# Patient Record
Sex: Female | Born: 1965 | Race: White | Hispanic: No | Marital: Married | Smoking: Former smoker
Health system: Southern US, Community
[De-identification: ages and names within clinical notes are randomized; demographics above are authoritative.]

## PROBLEM LIST (undated history)

## (undated) DIAGNOSIS — K219 Gastro-esophageal reflux disease without esophagitis: Secondary | ICD-10-CM

## (undated) DIAGNOSIS — G43909 Migraine, unspecified, not intractable, without status migrainosus: Secondary | ICD-10-CM

## (undated) DIAGNOSIS — J45909 Unspecified asthma, uncomplicated: Secondary | ICD-10-CM

## (undated) DIAGNOSIS — M5432 Sciatica, left side: Secondary | ICD-10-CM

## (undated) HISTORY — PX: GASTRIC BYPASS: SHX52

## (undated) HISTORY — PX: UMBILICAL HERNIA REPAIR: SHX196

## (undated) HISTORY — PX: ABDOMINAL HYSTERECTOMY: SHX81

## (undated) HISTORY — PX: SPLENECTOMY, TOTAL: SHX788

---

## 2010-07-01 ENCOUNTER — Ambulatory Visit: Payer: Self-pay | Admitting: Internal Medicine

## 2010-10-14 ENCOUNTER — Ambulatory Visit: Payer: Self-pay | Admitting: Family Medicine

## 2011-04-04 ENCOUNTER — Ambulatory Visit: Payer: Self-pay

## 2013-03-18 ENCOUNTER — Ambulatory Visit: Payer: Self-pay | Admitting: Bariatrics

## 2015-03-08 ENCOUNTER — Ambulatory Visit
Admission: EM | Admit: 2015-03-08 | Discharge: 2015-03-08 | Disposition: A | Discharge status: Home / Self Care | Payer: 59 | Attending: Family Medicine | Admitting: Family Medicine

## 2015-03-08 ENCOUNTER — Encounter: Payer: Self-pay | Admitting: Gynecology

## 2015-03-08 DIAGNOSIS — J018 Other acute sinusitis: Secondary | ICD-10-CM

## 2015-03-08 DIAGNOSIS — J069 Acute upper respiratory infection, unspecified: Secondary | ICD-10-CM

## 2015-03-08 HISTORY — DX: Migraine, unspecified, not intractable, without status migrainosus: G43.909

## 2015-03-08 HISTORY — DX: Unspecified asthma, uncomplicated: J45.909

## 2015-03-08 MED ORDER — AZITHROMYCIN 250 MG PO TABS: ORAL_TABLET | ORAL | Status: DC

## 2015-03-08 NOTE — ED Provider Notes (Signed)
CSN: [id number]     Arrival date & time 03/08/15  [id number] History   First MD Initiated Contact with Patient 03/08/15 1134     Chief Complaint  Patient presents with  . URI   (Consider location/radiation/quality/duration/timing/severity/associated sxs/prior Treatment) HPI: Patient presents today with symptoms of sinus congestion, mild productive cough, wheezing at times. Patient states that she's had the symptoms for the last 5 days. She does have a history of asthma and does take her albuterol as needed. She has had uses once to twice daily recently. She denies any chest pain, shortness of breath, nausea, vomiting, diarrhea, abdominal pain.  Past Medical History  Diagnosis Date  . Asthma   . Migraine    Past Surgical History  Procedure Laterality Date  . Gastric bypass  1997 and 2005  . Abdominal hysterectomy    . Umbilical hernia repair     No family history on file. Social History  Substance Use Topics  . Smoking status: Former Research scientist (life sciences)  . Smokeless tobacco: None  . Alcohol Use: No   OB History    No data available     Review of Systems: Negative except mentioned above.   Allergies  Penicillins; Asa; and Morphine and related  Home Medications   Prior to Admission medications   Medication Sig Start Date End Date Taking? Authorizing Provider  albuterol (PROVENTIL HFA;VENTOLIN HFA) 108 (90 Base) MCG/ACT inhaler Inhale into the lungs every 6 (six) hours as needed for wheezing or shortness of breath.   Yes Historical Provider, MD  cyclobenzaprine (FLEXERIL) 10 MG tablet Take 10 mg by mouth 3 (three) times daily as needed for muscle spasms.   Yes Historical Provider, MD  eletriptan (RELPAX) 40 MG tablet Take 40 mg by mouth as needed for migraine or headache. May repeat in 2 hours if headache persists or recurs.   Yes Historical Provider, MD  Promethazine HCl (PHENERGAN PO) Take 10 mg by mouth.   Yes Historical Provider, MD  SUMAtriptan (IMITREX) 6 MG/0.5ML SOLN injection Inject 6  mg into the skin every 2 (two) hours as needed for migraine or headache. May repeat in 2 hours if headache persists or recurs.   Yes Historical Provider, MD  topiramate (TOPAMAX) 100 MG tablet Take 100 mg by mouth 2 (two) times daily.   Yes Historical Provider, MD  vitamin B-12 (CYANOCOBALAMIN) 1000 MCG tablet Take 2,000 mcg by mouth daily.   Yes Historical Provider, MD  azithromycin (ZITHROMAX Z-PAK) 250 MG tablet Use as directed for 5 days. 03/08/15   Paulina Fusi, MD   Meds Ordered and Administered this Visit  Medications - No data to display  BP 110/63 mmHg  Pulse 72  Temp(Src) 98 F (36.7 C) (Oral)  Resp 18  Ht 5\' 2"  (1.575 m)  Wt 220 lb (99.791 kg)  BMI 40.23 kg/m2  SpO2 99% No data found.   Physical Exam   GENERAL: NAD HEENT: mild pharyngeal erythema, no exudate, no erythema of TMs, no cervical LAD RESP: CTA B, no accessory muscle use CARD: RRR NEURO: CN II-XII grossly intact   ED Course  Procedures (including critical care time)  Labs Review Labs Reviewed - No data to display  Imaging Review No results found.     MDM   1. URI (upper respiratory infection)   2. Other acute sinusitis   Discussed with patient to take oral antihistamine along with her nasal steroid, pseudoephedrine if needed. She can start the antibiotic in a few days if her symptoms do  not improve. Over-the-counter pain medication as needed, rest, hydration. She is to use her albuterol any inhaler as needed as well. If symptoms do persist or worsen I do recommend the patient seek medical attention as discussed.   Paulina Fusi, MD 03/08/15 1258

## 2015-03-08 NOTE — ED Notes (Signed)
Patient stated sinus drainage / green mucus / congestion/ coughing and wheezing.

## 2015-07-29 ENCOUNTER — Ambulatory Visit
Admission: EM | Admit: 2015-07-29 | Discharge: 2015-07-29 | Disposition: A | Discharge status: Home / Self Care | Payer: 59 | Attending: Family Medicine | Admitting: Family Medicine

## 2015-07-29 DIAGNOSIS — L259 Unspecified contact dermatitis, unspecified cause: Secondary | ICD-10-CM | POA: Diagnosis not present

## 2015-07-29 MED ORDER — PREDNISONE 20 MG PO TABS: 20.0000 mg | ORAL_TABLET | Freq: Every day | ORAL | Status: DC

## 2015-07-29 NOTE — ED Notes (Signed)
As per pt unsure if red tiny spots on her elbow hand legs and abdominal area are from poison oak or spider bites onset 6 days.

## 2015-07-29 NOTE — ED Provider Notes (Signed)
CSN: [id number]     Arrival date & time 07/29/15  1405 History   First MD Initiated Contact with Patient 07/29/15 1428     Chief Complaint  Patient presents with  . Poison Ivy   (Consider location/radiation/quality/duration/timing/severity/associated sxs/prior Treatment) HPI Comments: 50 yo female with a c/o itchy rash on legs and arms for 4-5 days after being exposed to poison oak while working in yard. Denies any fevers, chills, shortness of breath, wheezing, drainage.   The history is provided by the patient.    Past Medical History  Diagnosis Date  . Asthma   . Migraine    Past Surgical History  Procedure Laterality Date  . Gastric bypass  1997 and 2005  . Abdominal hysterectomy    . Umbilical hernia repair     No family history on file. Social History  Substance Use Topics  . Smoking status: Former Research scientist (life sciences)  . Smokeless tobacco: Not on file  . Alcohol Use: No   OB History    No data available     Review of Systems  Allergies  Penicillins; Asa; and Morphine and related  Home Medications   Prior to Admission medications   Medication Sig Start Date End Date Taking? Authorizing Provider  albuterol (PROVENTIL HFA;VENTOLIN HFA) 108 (90 Base) MCG/ACT inhaler Inhale into the lungs every 6 (six) hours as needed for wheezing or shortness of breath.   Yes Historical Provider, MD  cyclobenzaprine (FLEXERIL) 10 MG tablet Take 10 mg by mouth 3 (three) times daily as needed for muscle spasms.   Yes Historical Provider, MD  eletriptan (RELPAX) 40 MG tablet Take 40 mg by mouth as needed for migraine or headache. May repeat in 2 hours if headache persists or recurs.   Yes Historical Provider, MD  Promethazine HCl (PHENERGAN PO) Take 10 mg by mouth.   Yes Historical Provider, MD  SUMAtriptan (IMITREX) 6 MG/0.5ML SOLN injection Inject 6 mg into the skin every 2 (two) hours as needed for migraine or headache. May repeat in 2 hours if headache persists or recurs.   Yes Historical  Provider, MD  topiramate (TOPAMAX) 100 MG tablet Take 100 mg by mouth 2 (two) times daily.   Yes Historical Provider, MD  vitamin B-12 (CYANOCOBALAMIN) 1000 MCG tablet Take 2,000 mcg by mouth daily.   Yes Historical Provider, MD  azithromycin (ZITHROMAX Z-PAK) 250 MG tablet Use as directed for 5 days. 03/08/15   Paulina Fusi, MD  predniSONE (DELTASONE) 20 MG tablet Take 1 tablet (20 mg total) by mouth daily. 07/29/15   Norval Gable, MD   Meds Ordered and Administered this Visit  Medications - No data to display  BP 111/60 mmHg  Pulse 67  Temp(Src) 97.9 F (36.6 C) (Oral)  Resp 16  Ht 5\' 2"  (1.575 m)  Wt 230 lb (104.327 kg)  BMI 42.06 kg/m2  SpO2 98% No data found.   Physical Exam  Constitutional: She appears well-developed and well-nourished. No distress.  Skin: Rash noted. Rash is papular and vesicular. She is not diaphoretic. There is erythema.  Nursing note and vitals reviewed.   ED Course  Procedures (including critical care time)  Labs Review Labs Reviewed - No data to display  Imaging Review No results found.   Visual Acuity Review  Right Eye Distance:   Left Eye Distance:   Bilateral Distance:    Right Eye Near:   Left Eye Near:    Bilateral Near:         MDM  1. Contact dermatitis    Discharge Medication List as of 07/29/2015  2:43 PM    START taking these medications   Details  predniSONE (DELTASONE) 20 MG tablet Take 1 tablet (20 mg total) by mouth daily., Starting 07/29/2015, Until Discontinued, Normal       1.  diagnosis reviewed with patient 2. rx as per orders above; reviewed possible side effects, interactions, risks and benefits  3. Recommend supportive treatment with home steroid cream 4. Follow-up prn if symptoms worsen or don't improve    Norval Gable, MD 07/29/15 1622

## 2015-08-30 ENCOUNTER — Ambulatory Visit
Admission: EM | Admit: 2015-08-30 | Discharge: 2015-08-30 | Disposition: A | Discharge status: Home / Self Care | Payer: 59 | Attending: Family Medicine | Admitting: Family Medicine

## 2015-08-30 ENCOUNTER — Ambulatory Visit (INDEPENDENT_AMBULATORY_CARE_PROVIDER_SITE_OTHER): Payer: 59

## 2015-08-30 DIAGNOSIS — T148 Other injury of unspecified body region: Secondary | ICD-10-CM

## 2015-08-30 DIAGNOSIS — S62652A Nondisplaced fracture of medial phalanx of right middle finger, initial encounter for closed fracture: Secondary | ICD-10-CM | POA: Diagnosis not present

## 2015-08-30 DIAGNOSIS — T148XXA Other injury of unspecified body region, initial encounter: Secondary | ICD-10-CM

## 2015-08-30 MED ORDER — ACETAMINOPHEN 325 MG PO TABS
650.0000 mg | ORAL_TABLET | Freq: Once | ORAL | Status: AC
  Administered 2015-08-30: 650 mg via ORAL

## 2015-08-30 NOTE — Discharge Instructions (Signed)
Rest. Apply ice. Wear finger splint.   Follow up with your primary care physician this next week. Return to Urgent care for new or worsening concerns.

## 2015-08-30 NOTE — ED Provider Notes (Signed)
[facility] ____________________________________________  Time seen: Approximately 11:56 AM  I have reviewed the triage vital signs and the nursing notes.   HISTORY  Chief Complaint Finger Injury   HPI Marisa Lewis is a 50 y.o. female presents for evaluation of right middle finger pain. Patient reports 9 days ago she had went to [employer], and reports in the process of getting out of her ride she tripped on the edge side causing her to fall when getting out of the right. Patient reports when she fell she caught herself with her right arm and her right middle finger bent backwards. Patient also reports that she did hit her right forearm and right lower leg which has bruised and had minimal pain. Patient reports she is here for evaluation of right middle finger.  Cyst removal finger pain as mild. Patient reports pain is mostly when directly palpated. Patient reports she has been wearing a finger splint for the last few days that she got over-the-counter that does help with the pain. Denies any numbness or loss of sensation. Denies any pain radiation. Patient reports she is able to fully move the same finger but with pain with flexion and extension. Denies head injury or loss consciousness. Denies neck or back pain or injury. Denies any other complaints at this time.  Reports right hand dominant.  No LMP recorded. Patient is postmenopausal.  Pearletha Alfred, PA PCP   Past Medical History:  Diagnosis Date  . Asthma   . Migraine     There are no active problems to display for this patient.   Past Surgical History:  Procedure Laterality Date  . ABDOMINAL HYSTERECTOMY    . GASTRIC BYPASS  1997 and 2005  . UMBILICAL HERNIA REPAIR      No current facility-administered medications for this encounter.   Current Outpatient Prescriptions:  .  cyclobenzaprine (FLEXERIL) 10 MG tablet, Take 10 mg by mouth 3 (three) times daily as needed for muscle spasms., Disp: , Rfl:   .  eletriptan (RELPAX) 40 MG tablet, Take 40 mg by mouth as needed for migraine or headache. May repeat in 2 hours if headache persists or recurs., Disp: , Rfl:  .  Promethazine HCl (PHENERGAN PO), Take 10 mg by mouth., Disp: , Rfl:  .  SUMAtriptan (IMITREX) 6 MG/0.5ML SOLN injection, Inject 6 mg into the skin every 2 (two) hours as needed for migraine or headache. May repeat in 2 hours if headache persists or recurs., Disp: , Rfl:  .  topiramate (TOPAMAX) 100 MG tablet, Take 100 mg by mouth 2 (two) times daily., Disp: , Rfl:  .  vitamin B-12 (CYANOCOBALAMIN) 1000 MCG tablet, Take 2,000 mcg by mouth daily., Disp: , Rfl:  .  albuterol (PROVENTIL HFA;VENTOLIN HFA) 108 (90 Base) MCG/ACT inhaler, Inhale into the lungs every 6 (six) hours as needed for wheezing or shortness of breath., Disp: , Rfl:  .  azithromycin (ZITHROMAX Z-PAK) 250 MG tablet, Use as directed for 5 days., Disp: 6 each, Rfl: 0 .  predniSONE (DELTASONE) 20 MG tablet, Take 1 tablet (20 mg total) by mouth daily., Disp: 7 tablet, Rfl: 0  Allergies Penicillins; Asa [aspirin]; and Morphine and related  Family History  Problem Relation Age of Onset  . Kidney failure Mother     Social History Social History  Substance Use Topics  . Smoking status: Former Research scientist (life sciences)  . Smokeless tobacco: Never Used  . Alcohol use No    Review of Systems Constitutional: No fever/chills Eyes:  No visual changes. ENT: No sore throat. Cardiovascular: Denies chest pain. Respiratory: Denies shortness of breath. Gastrointestinal: No abdominal pain.  No nausea, no vomiting.  No diarrhea.  No constipation. Genitourinary: Negative for dysuria. Musculoskeletal: Negative for back pain. As above. Skin: Negative for rash. Neurological: Negative for headaches, focal weakness or numbness.  10-point ROS otherwise negative.  ____________________________________________   PHYSICAL EXAM:  VITAL SIGNS: ED Triage Vitals  Enc Vitals Group     BP 08/30/15  1123 120/74     Pulse Rate 08/30/15 1123 64     Resp 08/30/15 1123 16     Temp 08/30/15 1123 97.8 F (36.6 C)     Temp Source 08/30/15 1123 Tympanic     SpO2 08/30/15 1123 100 %     Weight 08/30/15 1123 235 lb (106.6 kg)     Height 08/30/15 1123 5\' 2"  (1.575 m)     Head Circumference --      Peak Flow --      Pain Score 08/30/15 1126 4     Pain Loc --      Pain Edu? --      Excl. in [place]? --     Constitutional: Alert and oriented. Well appearing and in no acute distress. Eyes: Conjunctivae are normal. PERRL. EOMI. ENT      Head: Normocephalic and atraumatic.      Mouth/Throat: Mucous membranes are moist. Cardiovascular: Normal rate, regular rhythm. Grossly normal heart sounds.  Good peripheral circulation. Respiratory: Normal respiratory effort without tachypnea nor retractions. Breath sounds are clear and equal bilaterally. No wheezes/rales/rhonchi.. Musculoskeletal:  Ambulatory with steady gait. No midline cervical, thoracic or lumbar tenderness to palpation. Bilateral pedal pulses equal and easily palpated. Except: right middle finger proximal middle phalanx and PIP joint mild tenderness to palpation, minimal swelling, no ecchymosis, mild pain with PIP flexion and extension but range of motion intact, right hand otherwise nontender, no motor or tendon deficits, able to touch thumb to each finger tip well without difficulty, able to make full fist, sensation intact with normal distal capillary refill.  Except: right posterior forearm mild ecchymosis, minimal soft tissue tenderness, no bony tenderness, full range of motion.  Except: right proximal medial tibial area mild ecchymosis, mild tenderness to direct palpation, knee nontender, no pain with range of motion with full range of motion intact, sensation intact, steady gait, right lower extremity otherwise nontender.  Neurologic:  Normal speech and language. No gross focal neurologic deficits are appreciated. Speech is normal. No gait  instability.  Skin:  Skin is warm, dry and intact. No rash noted. Psychiatric: Mood and affect are normal. Speech and behavior are normal. Patient exhibits appropriate insight and judgment.   ___________________________________________   LABS (all labs ordered are listed, but only abnormal results are displayed)  Labs Reviewed - No data to display ____________________________________________  RADIOLOGY  Dg Finger Middle Right  Result Date: 08/30/2015 CLINICAL DATA:  Tripped injuring the right third digit EXAM: RIGHT MIDDLE FINGER 2+V COMPARISON:  None. FINDINGS: On on the lateral view, there may be a small avulsion fragment from the base of the middle phalanx of the right third digit. No other abnormality is seen. Joint spaces appear normal. IMPRESSION: Cannot exclude a small avulsion fracture fragment from the base of the middle phalanx of the right third digit on one view only. Electronically Signed   By: Ivar Drape M.D.   On: 08/30/2015 12:14   ____________________________________________   PROCEDURES Procedures   Finger splint applied to  right third finger by CMA, neurovascular intact post application.   INITIAL IMPRESSION / ASSESSMENT AND PLAN / ED COURSE  Pertinent labs & imaging results that were available during my care of the patient were reviewed by me and considered in my medical decision making (see chart for details).  Presents for right middle finger pain post mechanical fall a week ago. Also with ecchymosis to right forearm and right lower leg, declines xrays of these areas. Will evaluate right middle finger xray.   Right middle finger xray per radiologist cannot exclude a small avulsion fracture fragment from the base of the middle phalanx of the right third digit on one view only. Discussed xray finding with patient, and as patient point tender in same location, suspect acute fracture. Finger splint. Encourage rest, ice and supportive measures. Declines need for  prescription medications. Discussed stretching. Follow up in one week with PCP.   Discussed follow up with Primary care physician this week. Discussed follow up and return parameters including no resolution or any worsening concerns. Patient verbalized understanding and agreed to plan.   ____________________________________________   FINAL CLINICAL IMPRESSION(S) / ED DIAGNOSES  Final diagnoses:  Closed nondisplaced fracture of middle phalanx of right middle finger, initial encounter  Contusion     Discharge Medication List as of 08/30/2015 12:33 PM      Note: This dictation was prepared with Dragon dictation along with smaller phrase technology. Any transcriptional errors that result from this process are unintentional.    Clinical Course      Marylene Land, NP 08/30/15 1324

## 2015-08-30 NOTE — ED Triage Notes (Signed)
Patient states that she fell out of the "[place] [employer] ride at [facility]" when it stopped. Patient states that this occurred 9 days ago. Patient reports pain and tenderness on right hand middle finger. Patient states that finger is painful to move with swelling.

## 2016-12-06 ENCOUNTER — Encounter: Payer: Self-pay | Admitting: *Deleted

## 2016-12-06 ENCOUNTER — Ambulatory Visit
Admission: EM | Admit: 2016-12-06 | Discharge: 2016-12-06 | Disposition: A | Discharge status: Home / Self Care | Payer: 59 | Attending: Family Medicine | Admitting: Family Medicine

## 2016-12-06 DIAGNOSIS — J988 Other specified respiratory disorders: Secondary | ICD-10-CM

## 2016-12-06 MED ORDER — DOXYCYCLINE HYCLATE 100 MG PO CAPS: 100.0000 mg | ORAL_CAPSULE | Freq: Two times a day (BID) | ORAL | 0 refills | Status: DC

## 2016-12-06 NOTE — ED Provider Notes (Signed)
[facility]    CSN: [id number] Arrival date & time: 12/06/16  1040  History   Chief Complaint Chief Complaint  Patient presents with  . Cough  . Nasal Congestion  . Facial Pain   HPI  51 year old female presents with concerns for sinusitis.  Patient reports that she has been sick since Monday.  Her wife is also been sick.  She reports sinus pressure, pain, and congestion.  Associated headache.  She is also had a productive cough.  She states that her symptoms seem to be persisting despite over-the-counter treatment with Mucinex, Benadryl, and NyQuil.  No known exacerbating factors.  She reports discolored sputum  with a cough and coming from the nose.  No other associated symptoms.  No other complaints at this time.  Past Medical History:  Diagnosis Date  . Asthma   . Migraine    Past Surgical History:  Procedure Laterality Date  . ABDOMINAL HYSTERECTOMY    . GASTRIC BYPASS  1997 and 2005  . UMBILICAL HERNIA REPAIR     OB History    No data available     Home Medications    Prior to Admission medications   Medication Sig Start Date End Date Taking? Authorizing Provider  albuterol (PROVENTIL HFA;VENTOLIN HFA) 108 (90 Base) MCG/ACT inhaler Inhale into the lungs every 6 (six) hours as needed for wheezing or shortness of breath.   Yes [provider]  SUMAtriptan (IMITREX) 6 MG/0.5ML SOLN injection Inject 6 mg into the skin every 2 (two) hours as needed for migraine or headache. May repeat in 2 hours if headache persists or recurs.   Yes [provider]  topiramate (TOPAMAX) 100 MG tablet Take 100 mg by mouth 2 (two) times daily.   Yes [provider]  vitamin B-12 (CYANOCOBALAMIN) 1000 MCG tablet Take 2,000 mcg by mouth daily.   Yes [provider]  cyclobenzaprine (FLEXERIL) 10 MG tablet Take 10 mg by mouth 3 (three) times daily as needed for muscle spasms.    [provider]  doxycycline (VIBRAMYCIN) 100 MG capsule  Take 1 capsule (100 mg total) by mouth 2 (two) times daily. 12/06/16   Coral Spikes, DO  eletriptan (RELPAX) 40 MG tablet Take 40 mg by mouth as needed for migraine or headache. May repeat in 2 hours if headache persists or recurs.    [provider]  Promethazine HCl (PHENERGAN PO) Take 10 mg by mouth.    [provider]    Family History Family History  Problem Relation Age of Onset  . Kidney failure Mother   . Diabetes Mother   . Hyperlipidemia Mother   . COPD Mother   . Diabetes Father     Social History Social History   Tobacco Use  . Smoking status: Former Research scientist (life sciences)  . Smokeless tobacco: Never Used  Substance Use Topics  . Alcohol use: No  . Drug use: No     Allergies   Penicillins; Asa [aspirin]; and Morphine and related   Review of Systems Review of Systems  Constitutional: Negative for fever.  HENT: Positive for congestion, sinus pressure and sinus pain.   Respiratory: Positive for cough.   Neurological: Positive for headaches.   Physical Exam Triage Vital Signs ED Triage Vitals  Enc Vitals Group     BP 12/06/16 1101 119/70     Pulse Rate 12/06/16 1101 73     Resp 12/06/16 1101 16     Temp 12/06/16 1101 98.2 F (36.8 C)  Temp Source 12/06/16 1101 Oral     SpO2 12/06/16 1101 99 %     Weight 12/06/16 1103 220 lb (99.8 kg)     Height 12/06/16 1103 5\' 1"  (1.549 m)     Head Circumference --      Peak Flow --      Pain Score --      Pain Loc --      Pain Edu? --      Excl. in [place]? --    No data found.  Updated Vital Signs BP 119/70 (BP Location: Left Arm)   Pulse 73   Temp 98.2 F (36.8 C) (Oral)   Resp 16   Ht 5\' 1"  (1.549 m)   Wt 220 lb (99.8 kg)   SpO2 99%   BMI 41.57 kg/m   Visual Acuity Right Eye Distance:   Left Eye Distance:   Bilateral Distance:    Right Eye Near:   Left Eye Near:    Bilateral Near:     Physical Exam  Constitutional: She is oriented to person, place, and time. She appears well-developed. No  distress.  HENT:  Head: Normocephalic and atraumatic.  Nose: Nose normal.  Oropharynx with mild erythema. No nasal discharge.  Neck: Neck supple.  Cardiovascular: Normal rate and regular rhythm.  No murmur heard. Pulmonary/Chest: Effort normal and breath sounds normal. She has no wheezes. She has no rales.  Lymphadenopathy:    She has no cervical adenopathy.  Neurological: She is alert and oriented to person, place, and time.  Skin: Skin is warm. No rash noted.  Psychiatric: She has a normal mood and affect. Her behavior is normal.  Vitals reviewed.    UC Treatments / Results  Labs (all labs ordered are listed, but only abnormal results are displayed) Labs Reviewed - No data to display  EKG  EKG Interpretation None       Radiology No results found.  Procedures Procedures (including critical care time)  Medications Ordered in UC Medications - No data to display   Initial Impression / Assessment and Plan / UC Course  I have reviewed the triage vital signs and the nursing notes.  Pertinent labs & imaging results that were available during my care of the patient were reviewed by me and considered in my medical decision making (see chart for details).     51 year old female presents with likely viral infection.  I advised her that this is likely viral and that her wife's infection is also likely viral.  She states that her wife is currently on antibiotics.  I discussed with her that I think she should continue supportive care and over-the-counter treatments.  I did give her an antibiotic if she fails to improve or worsens (delayed antibiotic use).  Final Clinical Impressions(s) / UC Diagnoses   Final diagnoses:  Respiratory infection    ED Discharge Orders        Ordered    doxycycline (VIBRAMYCIN) 100 MG capsule  2 times daily     12/06/16 1211     Controlled Substance Prescriptions [place] Controlled Substance Registry consulted? Not Applicable   Coral Spikes, DO 12/06/16 1229

## 2016-12-06 NOTE — Discharge Instructions (Signed)
This is likely viral.  I would continue your current medications and continue to give it some time.  You can fill the antibiotic if you worsen or fail to improve.  Take care  Dr. Lacinda Axon

## 2016-12-06 NOTE — ED Triage Notes (Signed)
Productive cough- yellow, nasal discharge- yellow, facial pain, x3-4 days.

## 2017-11-16 ENCOUNTER — Other Ambulatory Visit: Payer: Self-pay

## 2017-11-16 ENCOUNTER — Ambulatory Visit
Admission: EM | Admit: 2017-11-16 | Discharge: 2017-11-16 | Disposition: A | Discharge status: Home / Self Care | Payer: 59 | Attending: Family Medicine | Admitting: Family Medicine

## 2017-11-16 DIAGNOSIS — R059 Cough, unspecified: Secondary | ICD-10-CM

## 2017-11-16 DIAGNOSIS — R05 Cough: Secondary | ICD-10-CM

## 2017-11-16 DIAGNOSIS — J0100 Acute maxillary sinusitis, unspecified: Secondary | ICD-10-CM | POA: Diagnosis not present

## 2017-11-16 MED ORDER — DOXYCYCLINE HYCLATE 100 MG PO TABS: 100.0000 mg | ORAL_TABLET | Freq: Two times a day (BID) | ORAL | 0 refills | Status: DC

## 2017-11-16 NOTE — ED Triage Notes (Signed)
Patient complains of cough, congestion, ear pressure, headache, sinus pain and pressure x 8 days. Patient states that she has been trying sudafed and mucinex without much relief.

## 2017-11-16 NOTE — ED Provider Notes (Signed)
[facility]    CSN: [id number] Arrival date & time: 11/16/17  1103     History   Chief Complaint Chief Complaint  Patient presents with  . Cough    HPI Marisa Lewis is a 52 y.o. female.   The history is provided by the patient.  Cough  Associated symptoms: ear pain and rhinorrhea   Associated symptoms: no wheezing   URI  Presenting symptoms: congestion, cough, ear pain, facial pain and rhinorrhea   Severity:  Moderate Onset quality:  Sudden Duration:  8 days Timing:  Constant Progression:  Worsening Chronicity:  New Relieved by:  Nothing Ineffective treatments:  OTC medications Associated symptoms: sinus pain   Associated symptoms: no wheezing   Risk factors: chronic respiratory disease (asthma) and sick contacts     Past Medical History:  Diagnosis Date  . Asthma   . Migraine     There are no active problems to display for this patient.   Past Surgical History:  Procedure Laterality Date  . ABDOMINAL HYSTERECTOMY    . GASTRIC BYPASS  1997 and 2005  . UMBILICAL HERNIA REPAIR      OB History   None      Home Medications    Prior to Admission medications   Medication Sig Start Date End Date Taking? Authorizing Provider  albuterol (PROVENTIL HFA;VENTOLIN HFA) 108 (90 Base) MCG/ACT inhaler Inhale into the lungs every 6 (six) hours as needed for wheezing or shortness of breath.   Yes [provider]  cyclobenzaprine (FLEXERIL) 10 MG tablet Take 10 mg by mouth 3 (three) times daily as needed for muscle spasms.   Yes [provider]  eletriptan (RELPAX) 40 MG tablet Take 40 mg by mouth as needed for migraine or headache. May repeat in 2 hours if headache persists or recurs.   Yes [provider]  ferrous sulfate 325 (65 FE) MG tablet Take 325 mg by mouth daily with breakfast.   Yes [provider]  Promethazine HCl (PHENERGAN PO) Take 10 mg by mouth.   Yes [provider]  SAXENDA 18 MG/3ML SOPN   09/19/17  Yes [provider]  SUMAtriptan (IMITREX) 6 MG/0.5ML SOLN injection Inject 6 mg into the skin every 2 (two) hours as needed for migraine or headache. May repeat in 2 hours if headache persists or recurs.   Yes [provider]  topiramate (TOPAMAX) 100 MG tablet Take 100 mg by mouth 2 (two) times daily.   Yes [provider]  vitamin B-12 (CYANOCOBALAMIN) 1000 MCG tablet Take 2,000 mcg by mouth daily.   Yes [provider]  doxycycline (VIBRA-TABS) 100 MG tablet Take 1 tablet (100 mg total) by mouth 2 (two) times daily. 11/16/17   Norval Gable, MD    Family History Family History  Problem Relation Age of Onset  . Kidney failure Mother   . Diabetes Mother   . Hyperlipidemia Mother   . COPD Mother   . Diabetes Father     Social History Social History   Tobacco Use  . Smoking status: Former Research scientist (life sciences)  . Smokeless tobacco: Never Used  Substance Use Topics  . Alcohol use: No  . Drug use: No     Allergies   Penicillins; Asa [aspirin]; and Morphine and related   Review of Systems Review of Systems  HENT: Positive for congestion, ear pain, rhinorrhea and sinus pain.   Respiratory: Positive for cough. Negative for wheezing.      Physical Exam Triage  Vital Signs ED Triage Vitals  Enc Vitals Group     BP 11/16/17 1118 119/71     Pulse Rate 11/16/17 1118 (!) 18     Resp 11/16/17 1118 18     Temp 11/16/17 1118 98.3 F (36.8 C)     Temp Source 11/16/17 1118 Oral     SpO2 11/16/17 1118 99 %     Weight 11/16/17 1115 220 lb (99.8 kg)     Height 11/16/17 1115 5\' 1"  (1.549 m)     Head Circumference --      Peak Flow --      Pain Score 11/16/17 1115 3     Pain Loc --      Pain Edu? --      Excl. in [place]? --    No data found.  Updated Vital Signs BP 119/71 (BP Location: Right Arm)   Pulse (!) 18   Temp 98.3 F (36.8 C) (Oral)   Resp 18   Ht 5\' 1"  (1.549 m)   Wt 99.8 kg   SpO2 99%   BMI 41.57 kg/m   Visual Acuity Right  Eye Distance:   Left Eye Distance:   Bilateral Distance:    Right Eye Near:   Left Eye Near:    Bilateral Near:     Physical Exam  Constitutional: She appears well-developed and well-nourished. No distress.  HENT:  Head: Normocephalic and atraumatic.  Right Ear: Tympanic membrane, external ear and ear canal normal.  Left Ear: Tympanic membrane, external ear and ear canal normal.  Nose: Mucosal edema and rhinorrhea present. No nose lacerations, sinus tenderness, nasal deformity, septal deviation or nasal septal hematoma. No epistaxis.  No foreign bodies. Right sinus exhibits maxillary sinus tenderness and frontal sinus tenderness. Left sinus exhibits maxillary sinus tenderness and frontal sinus tenderness.  Mouth/Throat: Uvula is midline, oropharynx is clear and moist and mucous membranes are normal. No oropharyngeal exudate.  Eyes: Conjunctivae are normal. Right eye exhibits no discharge. Left eye exhibits no discharge. No scleral icterus.  Neck: Normal range of motion. Neck supple. No thyromegaly present.  Cardiovascular: Normal rate, regular rhythm and normal heart sounds.  Pulmonary/Chest: Effort normal and breath sounds normal. No stridor. No respiratory distress. She has no wheezes. She has no rales.  Lymphadenopathy:    She has no cervical adenopathy.  Skin: She is not diaphoretic.  Nursing note and vitals reviewed.    UC Treatments / Results  Labs (all labs ordered are listed, but only abnormal results are displayed) Labs Reviewed - No data to display  EKG None  Radiology No results found.  Procedures Procedures (including critical care time)  Medications Ordered in UC Medications - No data to display  Initial Impression / Assessment and Plan / UC Course  I have reviewed the triage vital signs and the nursing notes.  Pertinent labs & imaging results that were available during my care of the patient were reviewed by me and considered in my medical decision making  (see chart for details).      Final Clinical Impressions(s) / UC Diagnoses   Final diagnoses:  Acute maxillary sinusitis, recurrence not specified  Cough    ED Prescriptions    Medication Sig Dispense Auth. Provider   doxycycline (VIBRA-TABS) 100 MG tablet Take 1 tablet (100 mg total) by mouth 2 (two) times daily. 20 tablet Norval Gable, MD     1. diagnosis reviewed with patient 2. rx as per orders above; reviewed possible side effects, interactions, risks  and benefits  3. Recommend supportive treatment with otc flonase 4. Follow-up prn if symptoms worsen or don't improve  Controlled Substance Prescriptions [place] Controlled Substance Registry consulted? Not Applicable   Norval Gable, MD 11/16/17 1217

## 2018-03-31 ENCOUNTER — Encounter: Payer: Self-pay | Admitting: Family Medicine

## 2018-04-06 ENCOUNTER — Other Ambulatory Visit: Payer: Self-pay

## 2018-04-06 ENCOUNTER — Telehealth (INDEPENDENT_AMBULATORY_CARE_PROVIDER_SITE_OTHER): Payer: Managed Care, Other (non HMO) | Admitting: Gastroenterology

## 2018-04-06 ENCOUNTER — Telehealth: Payer: Self-pay | Admitting: Gastroenterology

## 2018-04-06 ENCOUNTER — Telehealth: Payer: Managed Care, Other (non HMO) | Admitting: Gastroenterology

## 2018-04-06 DIAGNOSIS — Z8719 Personal history of other diseases of the digestive system: Secondary | ICD-10-CM

## 2018-04-06 DIAGNOSIS — K921 Melena: Secondary | ICD-10-CM | POA: Diagnosis not present

## 2018-04-06 MED ORDER — HYDROCORTISONE ACETATE 25 MG RE SUPP: 25.0000 mg | Freq: Two times a day (BID) | RECTAL | 0 refills | Status: DC

## 2018-04-06 NOTE — Telephone Encounter (Signed)
Pt and [facility] notified that this was only blood sample for hemoglobin.  Pt is aware to check her my chart for summary of to do's.

## 2018-04-06 NOTE — Telephone Encounter (Signed)
Received a fax for pt rx Anucort-HC 25 mg supp 24S  Quant. [place]

## 2018-04-06 NOTE — Telephone Encounter (Signed)
Pt is at [facility] on [address] and the Lab needs the order for pt to get her stool kit

## 2018-04-06 NOTE — Progress Notes (Signed)
Marisa Lewis [address]  [place]  [place], [place] [address]  Main: [phone]  Fax: [phone]   Gastroenterology Consultation  Referring Provider:     Dahlia Client, MD Primary Care Physician:  Pearletha Alfred, PA Reason for Consultation:    Blood in stool        HPI:   Virtual Visit via Telephone Note  I connected with patient on 04/06/18 at 11:00 AM EDT by telephone and verified that I am speaking with the correct person using two identifiers.   I discussed the limitations, risks, security and privacy concerns of performing an evaluation and management service by telephone and the availability of in person appointments. I also discussed with the patient that there may be a patient responsible charge related to this service. The patient expressed understanding and agreed to proceed.  Location of the patient: Home Location of provider: Home Participating persons: Patient and provider only   History of Present Illness: CC: Blood in stool  Marisa Lewis is a 53 y.o. y/o female referred for consultation & management  by Dr. Pearletha Alfred, [place].  Patient reports history of bright red blood per rectum, ongoing intermittently since October November 2019.  Patient reports previous history of hemorrhoids and hemorrhoidectomy in 2018 in [place].  States frequency of blood per rectum has increased from once or twice a week to 4 times a week.  No abdominal pain, no nausea or vomiting, no weight loss.  Reports history of gastric bypass years ago.  States she had constipation when her hematochezia began, but now has 1-2 formed to loose bowel movements a day.  No melena.  No dysphagia or heartburn.  Reports history of a colonoscopy in 2015 at the [facility] surgical center.  States she was told polyps were removed and were benign.  I have reviewed her chart extensively and I am unable to find this record, including provation.  Past Medical History:  Diagnosis Date  .  Asthma   . Migraine     Past Surgical History:  Procedure Laterality Date  . ABDOMINAL HYSTERECTOMY    . GASTRIC BYPASS  1997 and 2005  . UMBILICAL HERNIA REPAIR      Prior to Admission medications   Medication Sig Start Date End Date Taking? Authorizing Provider  albuterol (PROVENTIL HFA;VENTOLIN HFA) 108 (90 Base) MCG/ACT inhaler Inhale into the lungs every 6 (six) hours as needed for wheezing or shortness of breath.   Yes [provider]  cyclobenzaprine (FLEXERIL) 10 MG tablet Take 10 mg by mouth 3 (three) times daily as needed for muscle spasms.   Yes [provider]  eletriptan (RELPAX) 40 MG tablet Take 40 mg by mouth as needed for migraine or headache. May repeat in 2 hours if headache persists or recurs.   Yes [provider]  ferrous sulfate 325 (65 FE) MG tablet Take 325 mg by mouth daily with breakfast.   Yes [provider]  omeprazole (PRILOSEC) 40 MG capsule Take 40 mg by mouth daily.   Yes [provider]  Promethazine HCl (PHENERGAN PO) Take 10 mg by mouth.   Yes [provider]  SAXENDA 18 MG/3ML SOPN  09/19/17  Yes [provider]  SUMAtriptan (IMITREX) 6 MG/0.5ML SOLN injection Inject 6 mg into the skin every 2 (two) hours as needed for migraine or headache. May repeat in 2 hours if headache persists or recurs.   Yes [provider]  topiramate (TOPAMAX) 100 MG tablet Take 100  mg by mouth 2 (two) times daily.   Yes [provider]  vitamin B-12 (CYANOCOBALAMIN) 1000 MCG tablet Take 2,000 mcg by mouth daily.   Yes [provider]    Family History  Problem Relation Age of Onset  . Kidney failure Mother   . Diabetes Mother   . Hyperlipidemia Mother   . COPD Mother   . Diabetes Father      Social History   Tobacco Use  . Smoking status: Former Research scientist (life sciences)  . Smokeless tobacco: Never Used  Substance Use Topics  . Alcohol use: No  . Drug use: No    Allergies as of 04/06/2018  - Review Complete 04/06/2018  Allergen Reaction Noted  . Penicillins Anaphylaxis 03/08/2015  . Asa [aspirin]  03/08/2015  . Morphine and related Nausea And Vomiting 03/08/2015    Review of Systems:    All systems reviewed and negative except where noted in HPI.   Observations/Objective:  Labs: Scanned labs from February 2020, available under labs, and were reviewed and show normal hemoglobin, normal liver enzymes, normal MCV.  Imaging Studies: No results found.  Assessment and Plan:   Marisa Lewis is a 53 y.o. y/o female has been referred for rectal bleeding  Assessment and Plan: Patient's symptoms are most consistent with likely underlying internal hemorrhoids.  She states she does not feel any external hemorrhoids when she wipes.  However, will obtain hemoglobin at this time.  If it is normal and has not decreased from a month ago from her February 2020 labs, I would be reassuring and consistent with bleeding being from underlying hemorrhoids over other etiologies.  We will also prescribe Anusol suppositories at this time and if this results in improvement or resolution of symptoms, that would also be consistent with hemorrhoids.    We will try to obtain her previous colonoscopy records  We will touch base with her again in 2 to 3 weeks to reassess how her symptoms are doing  We did discuss that given her colonoscopy has been 5 years ago, and she has new rectal bleeding, a colonoscopy this year would allow [place] to evaluate for any other underlying polyps, and rule out any other lesions that could contribute to blood per rectum.  However, this will need to be done once the schedules have opened up again for elective procedures.  She is hemodynamically stable, at this time.  If blood work shows anemia, or the blood per rectum worsens, or does not improve with Anusol suppositories, we may need to consider an urgent office visit or an urgent procedure as necessary.  Patient also  advised to start taking Metamucil to help prevent constipation and exacerbation of hemorrhoids  High-fiber diet.  (Please note that the referral states that rectal exam during their clinic visit was normal with no hemorrhoids seen.)  Follow Up Instructions: Tele-visit again in 2 to 3 weeks  I discussed the assessment and treatment plan with the patient. The patient was provided an opportunity to ask questions and all were answered. The patient agreed with the plan and demonstrated an understanding of the instructions.   The patient was advised to call back or seek an in-person evaluation if the symptoms worsen or if the condition fails to improve as anticipated.  I provided 17 minutes 31 seconds of non-face-to-face time during this encounter.   Virgel Manifold, MD  Speech recognition software was used to dictate the above note.

## 2018-04-07 ENCOUNTER — Other Ambulatory Visit: Payer: Self-pay

## 2018-04-07 ENCOUNTER — Other Ambulatory Visit: Payer: Self-pay | Admitting: Gastroenterology

## 2018-04-07 LAB — HEMOGLOBIN: Hemoglobin: 13.5 g/dL (ref 11.1–15.9)

## 2018-04-07 MED ORDER — HYDROCORTISONE 2.5 % RE CREA: 1.0000 "application " | TOPICAL_CREAM | Freq: Two times a day (BID) | RECTAL | 0 refills | Status: DC

## 2018-04-07 NOTE — Telephone Encounter (Signed)
Anusol suppository is costing the pt $300. I talked to the pharmacist and there is no other suppository that would be cheaper. They recommend Proctozone rectal cream which would be much cheaper for Marisa Lewis with directions for rectal insertion. Pharmacist took a verbal from me for the medicine and I have requested Marisa Lewis to give the rectal insertion/application instructions to the patient with it and she agreed. Twice a day for 10 days prescribed.

## 2018-04-28 ENCOUNTER — Encounter: Payer: Self-pay | Admitting: Gastroenterology

## 2018-04-28 ENCOUNTER — Ambulatory Visit (INDEPENDENT_AMBULATORY_CARE_PROVIDER_SITE_OTHER): Payer: Managed Care, Other (non HMO) | Admitting: Gastroenterology

## 2018-04-28 ENCOUNTER — Other Ambulatory Visit: Payer: Self-pay

## 2018-04-28 DIAGNOSIS — K5900 Constipation, unspecified: Secondary | ICD-10-CM | POA: Diagnosis not present

## 2018-04-28 DIAGNOSIS — K649 Unspecified hemorrhoids: Secondary | ICD-10-CM | POA: Diagnosis not present

## 2018-04-28 MED ORDER — HYDROCORTISONE 2.5 % RE CREA: 1.0000 "application " | TOPICAL_CREAM | Freq: Two times a day (BID) | RECTAL | 0 refills | Status: AC

## 2018-04-28 NOTE — Addendum Note (Signed)
Addended by: Earl Lagos on: 04/28/2018 09:59 AM   Modules accepted: Orders

## 2018-04-28 NOTE — Progress Notes (Signed)
Marisa Antigua, MD [address]  [place]  [place], [place] [address]  Main: [phone]  Fax: [phone]   Primary Care Physician: Pearletha Alfred, [place]  Virtual Visit via Video Note  I connected with patient on 04/28/18 at  9:30 AM EDT by video (using [email or web address]) and verified that I am speaking with the correct person using two identifiers.   I discussed the limitations, risks, security and privacy concerns of performing an evaluation and management service by video and the availability of in person appointments. I also discussed with the patient that there may be a patient responsible charge related to this service. The patient expressed understanding and agreed to proceed.  Location of Patient: Home Location of Provider: Home Persons involved: Patient and provider only (Nursing staff checked in patient via phone but were not physically involved in the video interaction - see their notes)   History of Present Illness: Chief Complaint  Patient presents with  . Follow-up    rectal bleeding    HPI: Marisa Lewis is a 53 y.o. female previously evaluated for intermittent bright red blood per rectum a few weeks ago, with clinical history most consistent with constipation induced hemorrhoids, now being seen for follow-up.  Anusol suppositories were prescribed on last visit but insurance did not cover this, therefore steroid cream was prescribed instead with instructions to inserted rectally with the applicator.  Patient reports significant improvement in frequency of symptoms.  States after using the cream, though bright red blood per rectum completely resolved for a few weeks, and has only recently reoccurred last week, with 2 episodes.  Has also been taking Metamucil which has allowed for formed stools which are not hard or loose, and allows her to have a better bowel movement.  No nausea vomiting, no abdominal pain, no weight loss.  Previous history: Reports history of  gastric bypass years ago.    Reports history of a colonoscopy in 2015 at the [facility] surgical center.  States she was told polyps were removed and were benign.  I have reviewed her chart extensively and I am unable to find this record, including provation.  Current Outpatient Medications  Medication Sig Dispense Refill  . albuterol (PROVENTIL HFA;VENTOLIN HFA) 108 (90 Base) MCG/ACT inhaler Inhale into the lungs every 6 (six) hours as needed for wheezing or shortness of breath.    . cyclobenzaprine (FLEXERIL) 10 MG tablet Take 10 mg by mouth 3 (three) times daily as needed for muscle spasms.    Marland Kitchen eletriptan (RELPAX) 40 MG tablet Take 40 mg by mouth as needed for migraine or headache. May repeat in 2 hours if headache persists or recurs.    . ferrous sulfate 325 (65 FE) MG tablet Take 325 mg by mouth daily with breakfast.    . omeprazole (PRILOSEC) 40 MG capsule Take 40 mg by mouth daily.    . Promethazine HCl (PHENERGAN PO) Take 10 mg by mouth.    Marland Kitchen SAXENDA 18 MG/3ML SOPN     . SUMAtriptan (IMITREX) 6 MG/0.5ML SOLN injection Inject 6 mg into the skin every 2 (two) hours as needed for migraine or headache. May repeat in 2 hours if headache persists or recurs.    . topiramate (TOPAMAX) 100 MG tablet Take 100 mg by mouth 2 (two) times daily.    . vitamin B-12 (CYANOCOBALAMIN) 1000 MCG tablet Take 2,000 mcg by mouth daily.    . Vitamin D, Ergocalciferol, (DRISDOL) 1.25 MG (50000 UT) CAPS capsule  No current facility-administered medications for this visit.     Allergies as of 04/28/2018 - Review Complete 04/28/2018  Allergen Reaction Noted  . Penicillins Anaphylaxis 03/08/2015  . Asa [aspirin]  03/08/2015  . Morphine and related Nausea And Vomiting 03/08/2015    Review of Systems:    All systems reviewed and negative except where noted in HPI.   Observations/Objective:  Labs: CMP  No results found for: NA, K, CL, CO2, GLUCOSE, BUN, CREATININE, CALCIUM, PROT, ALBUMIN, AST, ALT,  ALKPHOS, BILITOT, GFRNONAA, GFRAA Lab Results  Component Value Date   HGB 13.5 04/06/2018    Imaging Studies: No results found.  Assessment and Plan:   Marisa Lewis is a 53 y.o. y/o female with previous history of hemorrhoids and hemorrhoidectomy in 2018 in [place], with intermittent bright red blood per rectum  Assessment and Plan: Significant improvement in symptoms after using Metamucil, and topical steroid is all consistent with intermittent bright red blood per rectum being from likely underlying internal hemorrhoids.  Continue Metamucil daily However, since symptoms have reoccurred this last week, will try 1 more course of topical steroids.  If symptoms do not improve or worsen patient was asked to contact us and she verbalized understanding Continue high-fiber diet  Hemoglobin remained normal despite bright red blood per rectum occurring 4 times a week previously which is also reassuring.  We discussed that she should get a colonoscopy this year once schedule limitations due to COVID-19 are lifted.  This would also allow [place] to evaluate for any underlying lesions, evaluate her hemorrhoids and determine if she is a candidate for hemorrhoid banding with Dr. Marius Ditch.  Due to the current COVID-19 situation, elective procedures are currently being scheduled for a later time as per nationwide recommendations. Therefore, the patient was informed of the need to schedule the procedure in the upcoming months, instead of sooner, since this is an elective procedure. Patient will need to be contacted at a later time to place him/her on the schedule. However, alarm symptoms were discussed in detail, and if these occur pt was advised to call us to discuss change in symptoms and evaluation for a more urgent procedure if appropriate. No indication for urgent procedure exist at this time. Patient was given the contact information to reach [place] with any questions, concerns, or change in symptoms.     Follow Up Instructions: Clinic follow-up in 1 to 2 months to reassess symptoms   I discussed the assessment and treatment plan with the patient. The patient was provided an opportunity to ask questions and all were answered. The patient agreed with the plan and demonstrated an understanding of the instructions.   The patient was advised to call back or seek an in-person evaluation if the symptoms worsen or if the condition fails to improve as anticipated.  I provided 15 minutes of face-to-face time via video software during this encounter.   Virgel Manifold, MD  Speech recognition software was used to dictate this note.

## 2018-06-08 ENCOUNTER — Telehealth: Payer: Self-pay

## 2018-06-08 NOTE — Telephone Encounter (Signed)
-----   Message from Virgel Manifold, MD sent at 05/18/2018 12:00 PM EDT ----- This pt can be scheduled for her Colonoscopy for [facility] if she is agreeable. Schedule for a Tuesday in [place] if possible.

## 2018-06-08 NOTE — Telephone Encounter (Signed)
Unable to reach pt via phone, not available, will send my chart message to schedule colonoscopy for [facility].

## 2018-06-09 ENCOUNTER — Other Ambulatory Visit: Payer: Self-pay

## 2018-06-09 DIAGNOSIS — K625 Hemorrhage of anus and rectum: Secondary | ICD-10-CM

## 2018-06-09 MED ORDER — NA SULFATE-K SULFATE-MG SULF 17.5-3.13-1.6 GM/177ML PO SOLN: 1.0000 | Freq: Once | ORAL | 0 refills | Status: AC

## 2018-06-09 NOTE — Telephone Encounter (Signed)
Patient has been scheduled for her colonoscopy at [facility] on 06/16/18.  Her rx for Suprep has been sent to [facility] in [place] as requested.  Pt COVID testing 06/12/18.  Patient wanted me to remind you that she has a history of gastric bypass in regards to drinking the Suprep.  Thanks [employer]

## 2018-06-10 ENCOUNTER — Other Ambulatory Visit: Payer: Self-pay

## 2018-06-10 ENCOUNTER — Encounter: Payer: Self-pay | Admitting: *Deleted

## 2018-06-12 ENCOUNTER — Other Ambulatory Visit
Admission: RE | Admit: 2018-06-12 | Discharge: 2018-06-12 | Disposition: A | Discharge status: Home / Self Care | Payer: Managed Care, Other (non HMO) | Source: Ambulatory Visit | Attending: Gastroenterology | Admitting: Gastroenterology

## 2018-06-12 ENCOUNTER — Other Ambulatory Visit: Payer: Self-pay

## 2018-06-12 DIAGNOSIS — Z1159 Encounter for screening for other viral diseases: Secondary | ICD-10-CM | POA: Insufficient documentation

## 2018-06-12 DIAGNOSIS — Z01812 Encounter for preprocedural laboratory examination: Secondary | ICD-10-CM | POA: Diagnosis not present

## 2018-06-13 LAB — NOVEL CORONAVIRUS, NAA (HOSP ORDER, SEND-OUT TO REF LAB; TAT 18-24 HRS): SARS-CoV-2, NAA: NOT DETECTED

## 2018-06-16 ENCOUNTER — Encounter
Admission: RE | Disposition: A | Discharge status: Home / Self Care | Payer: Self-pay | Source: Home / Self Care | Attending: Gastroenterology

## 2018-06-16 ENCOUNTER — Ambulatory Visit
Admission: RE | Admit: 2018-06-16 | Discharge: 2018-06-16 | Disposition: A | Discharge status: Home / Self Care | Payer: Managed Care, Other (non HMO) | Attending: Gastroenterology | Admitting: Gastroenterology

## 2018-06-16 ENCOUNTER — Ambulatory Visit: Payer: Managed Care, Other (non HMO) | Admitting: Anesthesiology

## 2018-06-16 DIAGNOSIS — J45909 Unspecified asthma, uncomplicated: Secondary | ICD-10-CM | POA: Insufficient documentation

## 2018-06-16 DIAGNOSIS — D125 Benign neoplasm of sigmoid colon: Secondary | ICD-10-CM

## 2018-06-16 DIAGNOSIS — K648 Other hemorrhoids: Secondary | ICD-10-CM | POA: Diagnosis not present

## 2018-06-16 DIAGNOSIS — K625 Hemorrhage of anus and rectum: Secondary | ICD-10-CM | POA: Diagnosis present

## 2018-06-16 DIAGNOSIS — K6289 Other specified diseases of anus and rectum: Secondary | ICD-10-CM | POA: Diagnosis not present

## 2018-06-16 DIAGNOSIS — Z9884 Bariatric surgery status: Secondary | ICD-10-CM | POA: Diagnosis not present

## 2018-06-16 DIAGNOSIS — G43909 Migraine, unspecified, not intractable, without status migrainosus: Secondary | ICD-10-CM | POA: Diagnosis not present

## 2018-06-16 DIAGNOSIS — Z79899 Other long term (current) drug therapy: Secondary | ICD-10-CM | POA: Insufficient documentation

## 2018-06-16 DIAGNOSIS — Z87891 Personal history of nicotine dependence: Secondary | ICD-10-CM | POA: Diagnosis not present

## 2018-06-16 DIAGNOSIS — K219 Gastro-esophageal reflux disease without esophagitis: Secondary | ICD-10-CM | POA: Insufficient documentation

## 2018-06-16 DIAGNOSIS — K635 Polyp of colon: Secondary | ICD-10-CM | POA: Diagnosis not present

## 2018-06-16 HISTORY — DX: Sciatica, left side: M54.32

## 2018-06-16 HISTORY — PX: COLONOSCOPY WITH PROPOFOL: SHX5780

## 2018-06-16 HISTORY — DX: Gastro-esophageal reflux disease without esophagitis: K21.9

## 2018-06-16 HISTORY — PX: POLYPECTOMY: SHX5525

## 2018-06-16 SURGERY — COLONOSCOPY WITH PROPOFOL
Anesthesia: General | Site: Rectum

## 2018-06-16 MED ORDER — LIDOCAINE HCL (CARDIAC) PF 100 MG/5ML IV SOSY
PREFILLED_SYRINGE | INTRAVENOUS | Status: DC | PRN
  Administered 2018-06-16: 30 mg via INTRAVENOUS

## 2018-06-16 MED ORDER — STERILE WATER FOR IRRIGATION IR SOLN
Status: DC | PRN
  Administered 2018-06-16: 11:00:00

## 2018-06-16 MED ORDER — GLYCOPYRROLATE 0.2 MG/ML IJ SOLN
INTRAMUSCULAR | Status: DC | PRN
  Administered 2018-06-16: 0.1 mg via INTRAVENOUS

## 2018-06-16 MED ORDER — FENTANYL CITRATE (PF) 100 MCG/2ML IJ SOLN: 25.0000 ug | INTRAMUSCULAR | Status: DC | PRN

## 2018-06-16 MED ORDER — PROPOFOL 10 MG/ML IV BOLUS
INTRAVENOUS | Status: DC | PRN
  Administered 2018-06-16: 30 mg via INTRAVENOUS
  Administered 2018-06-16 (×3): 40 mg via INTRAVENOUS
  Administered 2018-06-16: 30 mg via INTRAVENOUS
  Administered 2018-06-16 (×3): 40 mg via INTRAVENOUS
  Administered 2018-06-16: 30 mg via INTRAVENOUS
  Administered 2018-06-16: 20 mg via INTRAVENOUS
  Administered 2018-06-16: 40 mg via INTRAVENOUS
  Administered 2018-06-16: 150 mg via INTRAVENOUS

## 2018-06-16 MED ORDER — SODIUM CHLORIDE 0.9 % IV SOLN: INTRAVENOUS | Status: DC

## 2018-06-16 MED ORDER — OXYCODONE HCL 5 MG PO TABS: 5.0000 mg | ORAL_TABLET | Freq: Once | ORAL | Status: DC | PRN

## 2018-06-16 MED ORDER — LACTATED RINGERS IV SOLN
10.0000 mL/h | INTRAVENOUS | Status: DC
  Administered 2018-06-16: 10 mL/h via INTRAVENOUS

## 2018-06-16 MED ORDER — MEPERIDINE HCL 25 MG/ML IJ SOLN: 6.2500 mg | INTRAMUSCULAR | Status: DC | PRN

## 2018-06-16 MED ORDER — OXYCODONE HCL 5 MG/5ML PO SOLN: 5.0000 mg | Freq: Once | ORAL | Status: DC | PRN

## 2018-06-16 MED ORDER — PROMETHAZINE HCL 25 MG/ML IJ SOLN: 6.2500 mg | INTRAMUSCULAR | Status: DC | PRN

## 2018-06-16 SURGICAL SUPPLY — 6 items
CANISTER SUCT 1200ML W/VALVE (MISCELLANEOUS) ×3 IMPLANT
FORCEPS BIOP RAD 4 LRG CAP 4 (CUTTING FORCEPS) ×3 IMPLANT
GOWN CVR UNV OPN BCK APRN NK (MISCELLANEOUS) ×2 IMPLANT
GOWN ISOL THUMB LOOP REG UNIV (MISCELLANEOUS) ×4
KIT ENDO PROCEDURE OLY (KITS) ×3 IMPLANT
WATER STERILE IRR 250ML POUR (IV SOLUTION) ×3 IMPLANT

## 2018-06-16 NOTE — Anesthesia Postprocedure Evaluation (Signed)
Anesthesia Post Note  Patient: Marisa Lewis  Procedure(s) Performed: COLONOSCOPY WITH BIOPSIES (N/A Rectum) POLYPECTOMY (N/A Rectum)  Patient location during evaluation: PACU Anesthesia Type: General Level of consciousness: awake and alert Pain management: pain level controlled Vital Signs Assessment: post-procedure vital signs reviewed and stable Respiratory status: spontaneous breathing, nonlabored ventilation, respiratory function stable and patient connected to nasal cannula oxygen Cardiovascular status: blood pressure returned to baseline and stable Postop Assessment: no apparent nausea or vomiting Anesthetic complications: no    Kezia Benevides ELAINE

## 2018-06-16 NOTE — Transfer of Care (Signed)
Immediate Anesthesia Transfer of Care Note  Patient: Marisa Lewis  Procedure(s) Performed: COLONOSCOPY WITH BIOPSIES (N/A Rectum) POLYPECTOMY (N/A Rectum)  Patient Location: PACU  Anesthesia Type: General  Level of Consciousness: awake, alert  and patient cooperative  Airway and Oxygen Therapy: Patient Spontanous Breathing and Patient connected to supplemental oxygen  Post-op Assessment: Post-op Vital signs reviewed, Patient's Cardiovascular Status Stable, Respiratory Function Stable, Patent Airway and No signs of Nausea or vomiting  Post-op Vital Signs: Reviewed and stable  Complications: No apparent anesthesia complications

## 2018-06-16 NOTE — Anesthesia Preprocedure Evaluation (Signed)
Anesthesia Evaluation  Patient identified by MRN, date of birth, ID band Patient awake    Reviewed: Allergy & Precautions, H&P , NPO status , Patient's Chart, lab work & pertinent test results, reviewed documented beta blocker date and time   Airway Mallampati: II  TM Distance: >3 FB Neck ROM: full    Dental no notable dental hx.    Pulmonary asthma , former smoker,    Pulmonary exam normal breath sounds clear to auscultation       Cardiovascular Exercise Tolerance: Good negative cardio ROS   Rhythm:regular Rate:Normal     Neuro/Psych  Headaches,  Neuromuscular disease negative psych ROS   GI/Hepatic Neg liver ROS, GERD  ,  Endo/Other  negative endocrine ROS  Renal/GU negative Renal ROS  negative genitourinary   Musculoskeletal   Abdominal   Peds  Hematology negative hematology ROS (+)   Anesthesia Other Findings   Reproductive/Obstetrics negative OB ROS                             Anesthesia Physical Anesthesia Plan  ASA: II  Anesthesia Plan: General   Post-op Pain Management:    Induction:   PONV Risk Score and Plan:   Airway Management Planned:   Additional Equipment:   Intra-op Plan:   Post-operative Plan:   Informed Consent: I have reviewed the patients History and Physical, chart, labs and discussed the procedure including the risks, benefits and alternatives for the proposed anesthesia with the patient or authorized representative who has indicated his/her understanding and acceptance.     Dental Advisory Given  Plan Discussed with: CRNA  Anesthesia Plan Comments:         Anesthesia Quick Evaluation

## 2018-06-16 NOTE — Anesthesia Procedure Notes (Signed)
Date/Time: 06/16/2018 10:27 AM Performed by: Cameron Ali, CRNA Pre-anesthesia Checklist: Patient identified, Emergency Drugs available, Suction available, Timeout performed and Patient being monitored Patient Re-evaluated:Patient Re-evaluated prior to induction Oxygen Delivery Method: Nasal cannula Placement Confirmation: positive ETCO2

## 2018-06-16 NOTE — Op Note (Signed)
[facility] Gastroenterology Patient Name: Marisa Lewis Procedure Date: 06/16/2018 10:24 AM MRN: [id number] Account #: [id number] Date of Birth: 03-Jul-1965 Admit Type: Outpatient Age: 53 Room: [facility] OR ROOM 01 Gender: Female Note Status: Finalized Procedure:            Colonoscopy Indications:          Rectal bleeding Providers:             B. Bonna Gains MD, MD Referring MD:         Volanda Napoleon, MD (Referring MD) Medicines:            Monitored Anesthesia Care Complications:        No immediate complications. Procedure:            Pre-Anesthesia Assessment:                       - ASA Grade Assessment: II - A patient with mild                        systemic disease.                       - Prior to the procedure, a History and Physical was                        performed, and patient medications, allergies and                        sensitivities were reviewed. The patient's tolerance of                        previous anesthesia was reviewed.                       - The risks and benefits of the procedure and the                        sedation options and risks were discussed with the                        patient. All questions were answered and informed                        consent was obtained.                       - Patient identification and proposed procedure were                        verified prior to the procedure by the physician, the                        nurse, the anesthesiologist, the anesthetist and the                        technician. The procedure was verified in the procedure                        room.                       After obtaining  informed consent, the colonoscope was                        passed under direct vision. Throughout the procedure,                        the patient's blood pressure, pulse, and oxygen                        saturations were monitored continuously. The was   introduced through the anus and advanced to the the                        cecum, identified by appendiceal orifice and ileocecal                        valve. The colonoscopy was performed with ease. The                        patient tolerated the procedure well. The quality of                        the bowel preparation was good. Findings:      The perianal and digital rectal examinations were normal.      Two sessile polyps were found in the sigmoid colon. The polyps were 3 to       4 mm in size. These polyps were removed with a cold biopsy forceps.       Resection and retrieval were complete.      A patchy area of mildly erythematous mucosa was found in the rectum.       Biopsies were taken with a cold forceps for histology.      The exam was otherwise without abnormality.      The rectum, sigmoid colon, descending colon, transverse colon, ascending       colon and cecum appeared normal.      Non-bleeding internal hemorrhoids were found during retroflexion. Impression:           - Two 3 to 4 mm polyps in the sigmoid colon, removed                        with a cold biopsy forceps. Resected and retrieved.                       - Erythematous mucosa in the rectum. Biopsied.                       - The examination was otherwise normal.                       - The rectum, sigmoid colon, descending colon,                        transverse colon, ascending colon and cecum are normal.                       - Non-bleeding internal hemorrhoids.                       - Pts BRBPR was due to hemorrhoids. Steroid  suppositories can be considered if symptoms reoccur Recommendation:       - Discharge patient to home (with escort).                       - Advance diet as tolerated.                       - Continue present medications.                       - Await pathology results.                       - Repeat colonoscopy in 5 years.                       - The findings  and recommendations were discussed with                        the patient.                       - The findings and recommendations were discussed with                        the patient's family.                       - Return to primary care physician as previously                        scheduled.                       - High fiber diet. Procedure Code(s):    --- Professional ---                       [phone], Colonoscopy, flexible; with biopsy, single or                        multiple Diagnosis Code(s):    --- Professional ---                       K63.5, Polyp of colon                       K62.89, Other specified diseases of anus and rectum                       K64.8, Other hemorrhoids                       K62.5, Hemorrhage of anus and rectum CPT copyright 2019 American Medical Association. All rights reserved. The codes documented in this report are preliminary and upon coder review may  be revised to meet current compliance requirements.  Vonda Antigua, MD Margretta Sidle B. Bonna Gains MD, MD 06/16/2018 11:07:28 AM This report has been signed electronically. Number of Addenda: 0 Note Initiated On: 06/16/2018 10:24 AM Scope Withdrawal Time: 0 hours 15 minutes 0 seconds  Total Procedure Duration: 0 hours 24 minutes 50 seconds  Estimated Blood Loss: Estimated blood loss: none.      [facility]

## 2018-06-16 NOTE — H&P (Signed)
Marisa Antigua, MD [address] [place], [place], [place], [address] [address], [place], [place], [place], [address] Phone: [phone]  Fax: [phone]  Primary Care Physician:  Pearletha Alfred, [place]   Pre-Procedure History & Physical: HPI:  Marisa Lewis is a 53 y.o. female is here for a colonoscopy.   Past Medical History:  Diagnosis Date  . Asthma    rare  . GERD (gastroesophageal reflux disease)   . Migraine    approx 1x/wk  . Sciatica of left side     Past Surgical History:  Procedure Laterality Date  . ABDOMINAL HYSTERECTOMY    . GASTRIC BYPASS  1997 and 2005  . UMBILICAL HERNIA REPAIR      Prior to Admission medications   Medication Sig Start Date End Date Taking? Authorizing Provider  albuterol (PROVENTIL HFA;VENTOLIN HFA) 108 (90 Base) MCG/ACT inhaler Inhale into the lungs every 6 (six) hours as needed for wheezing or shortness of breath.   Yes [provider]  cetirizine (ZYRTEC) 10 MG tablet Take 10 mg by mouth daily.   Yes [provider]  cyclobenzaprine (FLEXERIL) 10 MG tablet Take 10 mg by mouth 3 (three) times daily as needed for muscle spasms.   Yes [provider]  eletriptan (RELPAX) 40 MG tablet Take 40 mg by mouth as needed for migraine or headache. May repeat in 2 hours if headache persists or recurs.   Yes [provider]  ferrous sulfate 325 (65 FE) MG tablet Take 325 mg by mouth daily with breakfast.   Yes [provider]  fluticasone (FLONASE) 50 MCG/ACT nasal spray Place 2 sprays into both nostrils daily.   Yes [provider]  omeprazole (PRILOSEC) 40 MG capsule Take 40 mg by mouth daily.   Yes [provider]  Promethazine HCl (PHENERGAN PO) Take 10 mg by mouth.   Yes [provider]  SAXENDA 18 MG/3ML SOPN  09/19/17  Yes [provider]  SUMAtriptan (IMITREX) 6 MG/0.5ML SOLN injection Inject 6 mg into the skin every 2 (two) hours as needed for migraine  or headache. May repeat in 2 hours if headache persists or recurs.   Yes [provider]  topiramate (TOPAMAX) 100 MG tablet Take 100 mg by mouth 2 (two) times daily.   Yes [provider]  vitamin B-12 (CYANOCOBALAMIN) 1000 MCG tablet Take 2,000 mcg by mouth daily.   Yes [provider]  Vitamin D, Ergocalciferol, (DRISDOL) 1.25 MG (50000 UT) CAPS capsule  02/16/18  Yes [provider]  zolpidem (AMBIEN) 5 MG tablet Take 5 mg by mouth at bedtime as needed for sleep.   Yes [provider]    Allergies as of 06/09/2018 - Review Complete 04/28/2018  Allergen Reaction Noted  . Penicillins Anaphylaxis 03/08/2015  . Asa [aspirin]  03/08/2015  . Morphine and related Nausea And Vomiting 03/08/2015    Family History  Problem Relation Age of Onset  . Kidney failure Mother   . Diabetes Mother   . Hyperlipidemia Mother   . COPD Mother   . Diabetes Father     Social History   Socioeconomic History  . Marital status: Married    Spouse name: Not on file  . Number of children: Not on file  . Years of education: Not on file  . Highest education level: Not on file  Occupational History  . Not on file  Social Needs  . Financial resource strain: Not on file  . Food insecurity:  Worry: Not on file    Inability: Not on file  . Transportation needs:    Medical: Not on file    Non-medical: Not on file  Tobacco Use  . Smoking status: Former Smoker    Last attempt to quit: 12/31/2014    Years since quitting: 3.4  . Smokeless tobacco: Never Used  Substance and Sexual Activity  . Alcohol use: No  . Drug use: No  . Sexual activity: Not on file  Lifestyle  . Physical activity:    Days per week: Not on file    Minutes per session: Not on file  . Stress: Not on file  Relationships  . Social connections:    Talks on phone: Not on file    Gets together: Not on file    Attends religious service: Not on file    Active member of club or  organization: Not on file    Attends meetings of clubs or organizations: Not on file    Relationship status: Not on file  . Intimate partner violence:    Fear of current or ex partner: Not on file    Emotionally abused: Not on file    Physically abused: Not on file    Forced sexual activity: Not on file  Other Topics Concern  . Not on file  Social History Narrative  . Not on file    Review of Systems: See HPI, otherwise negative ROS  Physical Exam: Ht 5\' 1"  (9.811 m)   Wt 108 kg   BMI 44.97 kg/m  General:   Alert,  pleasant and cooperative in NAD Head:  Normocephalic and atraumatic. Neck:  Supple; no masses or thyromegaly. Lungs:  Clear throughout to auscultation, normal respiratory effort.    Heart:  +S1, +S2, Regular rate and rhythm, No edema. Abdomen:  Soft, nontender and nondistended. Normal bowel sounds, without guarding, and without rebound.   Neurologic:  Alert and  oriented x4;  grossly normal neurologically.  Impression/Plan: Marisa Lewis is here for a colonoscopy to be performed for BRBPR Risks, benefits, limitations, and alternatives regarding  colonoscopy have been reviewed with the patient.  Questions have been answered.  All parties agreeable.   Virgel Manifold, MD  06/16/2018, 9:53 AM

## 2018-06-17 ENCOUNTER — Encounter: Payer: Self-pay | Admitting: Gastroenterology

## 2018-06-18 LAB — SURGICAL PATHOLOGY

## 2018-06-23 ENCOUNTER — Encounter: Payer: Self-pay | Admitting: Gastroenterology

## 2018-07-15 ENCOUNTER — Ambulatory Visit
Admission: EM | Admit: 2018-07-15 | Discharge: 2018-07-15 | Disposition: A | Discharge status: Other Acute Inpatient Hospital | Payer: Managed Care, Other (non HMO) | Attending: Family Medicine | Admitting: Family Medicine

## 2018-07-15 ENCOUNTER — Other Ambulatory Visit: Payer: Self-pay

## 2018-07-15 ENCOUNTER — Ambulatory Visit
Admission: RE | Admit: 2018-07-15 | Discharge: 2018-07-15 | Disposition: A | Discharge status: Home / Self Care | Payer: Managed Care, Other (non HMO) | Source: Ambulatory Visit | Attending: Family Medicine | Admitting: Family Medicine

## 2018-07-15 DIAGNOSIS — R1031 Right lower quadrant pain: Secondary | ICD-10-CM | POA: Diagnosis not present

## 2018-07-15 DIAGNOSIS — Z9081 Acquired absence of spleen: Secondary | ICD-10-CM | POA: Insufficient documentation

## 2018-07-15 DIAGNOSIS — Z9884 Bariatric surgery status: Secondary | ICD-10-CM | POA: Insufficient documentation

## 2018-07-15 DIAGNOSIS — N289 Disorder of kidney and ureter, unspecified: Secondary | ICD-10-CM | POA: Insufficient documentation

## 2018-07-15 LAB — COMPREHENSIVE METABOLIC PANEL
ALT: 14 U/L (ref 0–44)
AST: 19 U/L (ref 15–41)
Albumin: 3.8 g/dL (ref 3.5–5.0)
Alkaline Phosphatase: 49 U/L (ref 38–126)
Anion gap: 9 (ref 5–15)
BUN: 12 mg/dL (ref 6–20)
CO2: 21 mmol/L — ABNORMAL LOW (ref 22–32)
Calcium: 8.9 mg/dL (ref 8.9–10.3)
Chloride: 108 mmol/L (ref 98–111)
Creatinine, Ser: 0.52 mg/dL (ref 0.44–1.00)
GFR calc Af Amer: >60 mL/min (ref >60)
GFR calc non Af Amer: >60 mL/min (ref >60)
Glucose, Bld: 93 mg/dL (ref 70–99)
Potassium: 3.9 mmol/L (ref 3.5–5.1)
Sodium: 138 mmol/L (ref 135–145)
Total Bilirubin: 0.5 mg/dL (ref 0.3–1.2)
Total Protein: 6.9 g/dL (ref 6.5–8.1)

## 2018-07-15 MED ORDER — IOHEXOL 300 MG/ML  SOLN
100.0000 mL | Freq: Once | INTRAMUSCULAR | Status: AC | PRN
  Administered 2018-07-15: 100 mL via INTRAVENOUS

## 2018-07-15 MED ORDER — TRAMADOL HCL 50 MG PO TABS: 50.0000 mg | ORAL_TABLET | Freq: Three times a day (TID) | ORAL | 0 refills | Status: AC | PRN

## 2018-07-15 NOTE — ED Triage Notes (Signed)
Patient complains of right sided abdominal pain. Patient states that she had an emergency spleen removal 3 weeks ago. Patient states that pain has been severe/stabbing at times, she thought it was gas and took some gas medication but did not relieve. Patient states that this pain started last night.

## 2018-07-15 NOTE — ED Notes (Signed)
Ct Abd/Pelvis with contrast approval obtained from [facility]. Approval [phone].   28 minutes on the phone for approval.

## 2018-07-15 NOTE — Discharge Instructions (Signed)
I will call with the results.  Take care  Dr. Jerrin Recore  

## 2018-07-15 NOTE — ED Provider Notes (Addendum)
[facility]    CSN: [id number] Arrival date & time: 07/15/18  2595  History   Chief Complaint Chief Complaint  Patient presents with   Abdominal Pain   HPI  53 year old female presents with abdominal pain.  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  She has had an uncomplicated postoperative course.  Patient was seen by her general surgeon on 6/25.  She was doing well at that time.  Patient states that she developed right lower quadrant pain yesterday evening.  She states that it is worse with movement.  She has taken Gas-X without improvement.  No relieving factors.  Eating normally.  No changes in her bowel or bladder.  No fever.  Pain is currently 6/10 in severity.  No other associated symptoms.  No other complaints or concerns at this time.  PMH, Surgical Hx, Family Hx, Social History reviewed and updated as below. Past Medical History:  Diagnosis Date   Asthma    rare   GERD (gastroesophageal reflux disease)    Migraine    approx 1x/wk   Sciatica of left side    Patient Active Problem List   Diagnosis Date Noted   Rectal bleeding    Polyp of sigmoid colon    Internal hemorrhoids    Past Surgical History:  Procedure Laterality Date   ABDOMINAL HYSTERECTOMY     COLONOSCOPY WITH PROPOFOL N/A 06/16/2018   Procedure: COLONOSCOPY WITH BIOPSIES;  Surgeon: Virgel Manifold, MD;  Location: [place];  Service: Endoscopy;  Laterality: N/A;   GASTRIC BYPASS  1997 and 2005   POLYPECTOMY N/A 06/16/2018   Procedure: POLYPECTOMY;  Surgeon: Virgel Manifold, MD;  Location: [place];  Service: Endoscopy;  Laterality: N/A;   SPLENECTOMY, TOTAL     UMBILICAL HERNIA REPAIR     OB History   No obstetric history on file.    Home Medications    Prior to Admission medications   Medication Sig Start Date End Date Taking? Authorizing Provider  albuterol  (PROVENTIL HFA;VENTOLIN HFA) 108 (90 Base) MCG/ACT inhaler Inhale into the lungs every 6 (six) hours as needed for wheezing or shortness of breath.   Yes [provider]  cetirizine (ZYRTEC) 10 MG tablet Take 10 mg by mouth daily.   Yes [provider]  cyclobenzaprine (FLEXERIL) 10 MG tablet Take 10 mg by mouth 3 (three) times daily as needed for muscle spasms.   Yes [provider]  eletriptan (RELPAX) 40 MG tablet Take 40 mg by mouth as needed for migraine or headache. May repeat in 2 hours if headache persists or recurs.   Yes [provider]  ferrous sulfate 325 (65 FE) MG tablet Take 325 mg by mouth daily with breakfast.   Yes [provider]  fluticasone (FLONASE) 50 MCG/ACT nasal spray Place 2 sprays into both nostrils daily.   Yes [provider]  omeprazole (PRILOSEC) 40 MG capsule Take 40 mg by mouth daily.   Yes [provider]  Promethazine HCl (PHENERGAN PO) Take 10 mg by mouth.   Yes [provider]  SAXENDA 18 MG/3ML SOPN  09/19/17  Yes [provider]  SUMAtriptan (IMITREX) 6 MG/0.5ML SOLN injection Inject 6 mg into the skin every 2 (two) hours as needed for migraine or headache. May repeat in 2 hours if headache persists or recurs.   Yes [provider]  topiramate (TOPAMAX) 100 MG tablet Take 100 mg by mouth 2 (  two) times daily.   Yes [provider]  vitamin B-12 (CYANOCOBALAMIN) 1000 MCG tablet Take 2,000 mcg by mouth daily.   Yes [provider]  Vitamin D, Ergocalciferol, (DRISDOL) 1.25 MG (50000 UT) CAPS capsule  02/16/18  Yes [provider]  zolpidem (AMBIEN) 5 MG tablet Take 5 mg by mouth at bedtime as needed for sleep.   Yes [provider]  traMADol (ULTRAM) 50 MG tablet Take 1 tablet (50 mg total) by mouth every 8 (eight) hours as needed. 07/15/18   Coral Spikes, DO   Family History Family History  Problem Relation Age of Onset   Kidney failure  Mother    Diabetes Mother    Hyperlipidemia Mother    COPD Mother    Diabetes Father    Social History Social History   Tobacco Use   Smoking status: Former Smoker    Quit date: 12/31/2014    Years since quitting: 3.5   Smokeless tobacco: Never Used  Substance Use Topics   Alcohol use: No   Drug use: No   Allergies   Penicillins, Asa [aspirin], and Morphine and related   Review of Systems Review of Systems  Constitutional: Negative for fever.  Gastrointestinal: Positive for abdominal pain.   Physical Exam Triage Vital Signs ED Triage Vitals  Enc Vitals Group     BP 07/15/18 0843 118/71     Pulse Rate 07/15/18 0843 72     Resp 07/15/18 0843 16     Temp 07/15/18 0843 98.3 F (36.8 C)     Temp Source 07/15/18 0843 Oral     SpO2 07/15/18 0843 100 %     Weight 07/15/18 0839 226 lb (102.5 kg)     Height 07/15/18 0839 5\' 1"  (1.549 m)     Head Circumference --      Peak Flow --      Pain Score 07/15/18 0839 6     Pain Loc --      Pain Edu? --      Excl. in [place]? --    Updated Vital Signs BP 118/71 (BP Location: Left Arm)    Pulse 72    Temp 98.3 F (36.8 C) (Oral)    Resp 16    Ht 5\' 1"  (1.549 m)    Wt 102.5 kg    SpO2 100%    BMI 42.70 kg/m   Visual Acuity Right Eye Distance:   Left Eye Distance:   Bilateral Distance:    Right Eye Near:   Left Eye Near:    Bilateral Near:     Physical Exam Vitals signs and nursing note reviewed.  Constitutional:      General: She is not in acute distress.    Appearance: She is well-developed. She is obese.  HENT:     Head: Normocephalic and atraumatic.  Eyes:     General:        Right eye: No discharge.        Left eye: No discharge.     Conjunctiva/sclera: Conjunctivae normal.  Cardiovascular:     Rate and Rhythm: Normal rate and regular rhythm.  Pulmonary:     Effort: Pulmonary effort is normal.     Breath sounds: Normal breath sounds. No wheezing, rhonchi or rales.  Abdominal:     General: There is no  distension.     Palpations: Abdomen is soft.     Comments: Tender to palpation in the right lower quadrant.   Neurological:  Mental Status: She is alert.  Psychiatric:        Mood and Affect: Mood normal.        Behavior: Behavior normal.    UC Treatments / Results  Labs (all labs ordered are listed, but only abnormal results are displayed) Labs Reviewed  COMPREHENSIVE METABOLIC PANEL - Abnormal; Notable for the following components:      Result Value   CO2 21 (*)    All other components within normal limits    EKG   Radiology Ct Abdomen Pelvis W Contrast  Result Date: 07/15/2018 CLINICAL DATA:  Right lower quadrant abdominal pain since yesterday. Patient had a splenectomy 3 weeks ago. EXAM: CT ABDOMEN AND PELVIS WITH CONTRAST TECHNIQUE: Multidetector CT imaging of the abdomen and pelvis was performed using the standard protocol following bolus administration of intravenous contrast. CONTRAST:  167mL OMNIPAQUE IOHEXOL 300 MG/ML  SOLN COMPARISON:  03/18/2013 FINDINGS: Lower chest: The lung bases are clear of acute process. No pleural effusion or pulmonary lesions. The heart is normal in size. No pericardial effusion. The distal esophagus and aorta are unremarkable. Hepatobiliary: No focal hepatic lesions or intrahepatic biliary dilatation. Gallbladder is surgically absent. No common bile duct dilatation. Pancreas: No mass, inflammation or ductal dilatation. Spleen: Status post splenectomy. No hematoma or abscess. Small splenule noted. Adrenals/Urinary Tract: The adrenal glands and kidneys are unremarkable. There is a moderate-size cortical defect involving the posterior aspect of the left kidney possibly due to prior infection or infarction. This is new since 2015. No renal, ureteral or bladder calculi or mass. No collecting system abnormalities on the delayed images. Both ureters are normal. Stomach/Bowel: Surgical changes from gastric bypass surgery. No complicating features are  identified. The small bowel and colon are unremarkable. No findings for obstruction or perforation. No acute inflammatory changes. The terminal ileum and appendix are normal. Vascular/Lymphatic: Scattered atherosclerotic calcifications involving the aorta and iliac arteries. No aneurysm or dissection. The branch vessels are patent. The major venous structures are patent. Reproductive: The uterus is surgically absent. The right ovary is still present. I do not see the left ovary for certain. Other: No pelvic mass or adenopathy. No free pelvic fluid collections. No inguinal mass or adenopathy. Stable surgical changes involving the anterior abdominal wall from a prior hernia repair with mesh. No recurrent hernia. Musculoskeletal: Musculoskeletal no significant bony findings. IMPRESSION: 1. No CT findings to account for the patient's right lower quadrant abdominal pain. The appendix is normal. The right ovary is normal. 2. Surgical changes from gastric bypass surgery without complicating features. 3. Status post splenectomy.  No hematoma or abscess. 4. 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 5. Surgical changes from anterior abdominal wall hernia repair with mesh. No recurrent hernia. Electronically Signed   By: Marijo Sanes M.D.   On: 07/15/2018 11:57    Procedures Procedures (including critical care time)  Medications Ordered in UC Medications - No data to display  Initial Impression / Assessment and Plan / UC Course  I have reviewed the triage vital signs and the nursing notes.  Pertinent labs & imaging results that were available during my care of the patient were reviewed by me and considered in my medical decision making (see chart for details).    53 year old female presents with right lower quadrant pain.  Patient quite tender on exam.  Given recent events and current exam, patient is being sent for CT.  CT is unavailable at our site today.  Sending  to  the hospital for CT.  Metabolic panel was obtained.  Awaiting results.  Could not obtain CBC as she was a difficult stick.  1205 PM - Labs normal. CT negative. Tramadol as needed for pain.  Final Clinical Impressions(s) / UC Diagnoses   Final diagnoses:  RLQ abdominal pain     Discharge Instructions     I will call with the results.  Take care  Dr. Lacinda Axon     ED Prescriptions    Medication Sig Dispense Auth. Provider   traMADol (ULTRAM) 50 MG tablet Take 1 tablet (50 mg total) by mouth every 8 (eight) hours as needed. 10 tablet Coral Spikes, DO     Controlled Substance Prescriptions [place] Controlled Substance Registry consulted? Yes, I have consulted the [place] Controlled Substances Registry for this patient, and feel the risk/benefit ratio today is favorable for proceeding with this prescription for a controlled substance.   Coral Spikes, [place] 07/15/18 1206

## 2018-07-17 ENCOUNTER — Telehealth (HOSPITAL_COMMUNITY): Payer: Self-pay | Admitting: Emergency Medicine

## 2018-07-17 NOTE — Telephone Encounter (Signed)
Normal labs. Attempted to reach patient. No answer at this time.

## 2018-07-28 ENCOUNTER — Ambulatory Visit: Payer: Managed Care, Other (non HMO) | Admitting: Gastroenterology

## 2018-08-12 ENCOUNTER — Other Ambulatory Visit: Payer: Self-pay | Admitting: Family Medicine

## 2019-03-19 ENCOUNTER — Other Ambulatory Visit: Payer: Self-pay

## 2019-03-19 ENCOUNTER — Ambulatory Visit: Payer: Managed Care, Other (non HMO) | Attending: Internal Medicine

## 2019-03-19 DIAGNOSIS — Z23 Encounter for immunization: Secondary | ICD-10-CM

## 2019-03-19 NOTE — Progress Notes (Signed)
   Covid-19 Vaccination Clinic  Name:  JACELYNN HUX    MRN: [id number] DOB: 07-26-1965  03/19/2019  Ms. Georgina Peer was observed post Covid-19 immunization for 15 minutes without incident. She was provided with Vaccine Information Sheet and instruction to access the V-Safe system.   Ms. Georgina Peer was instructed to call 911 with any severe reactions post vaccine: Marland Kitchen Difficulty breathing  . Swelling of face and throat  . A fast heartbeat  . A bad rash all over body  . Dizziness and weakness   Immunizations Administered    Name Date Dose VIS Date Route   Pfizer COVID-19 Vaccine 03/19/2019 11:44 AM 0.3 mL 12/18/2018 Intramuscular   Manufacturer: [place]   Lot: [id number]   [place]: [id number]

## 2019-04-13 ENCOUNTER — Ambulatory Visit: Payer: Managed Care, Other (non HMO) | Attending: Internal Medicine

## 2019-04-13 DIAGNOSIS — Z23 Encounter for immunization: Secondary | ICD-10-CM

## 2019-04-13 NOTE — Progress Notes (Signed)
   [facility] Vaccination [facility]  Name:  Marisa Lewis    MRN: [id number] DOB: 30-Jul-1965  04/13/2019  [name] was observed post Covid-19 immunization for 15 minutes without incident. She was provided with Vaccine Information Sheet and instruction to access the V-Safe system.   [name] was instructed to call 911 with any severe reactions post vaccine: Marland Kitchen Difficulty breathing  . Swelling of face and throat  . A fast heartbeat  . A bad rash all over body  . Dizziness and weakness   Immunizations Administered    Name Date Dose VIS Date Route   Pfizer COVID-19 Vaccine 04/13/2019 10:27 AM 0.3 mL 12/18/2018 Intramuscular   Manufacturer: [place]   Lot: [id number]   [place]: [id number]

## 2019-06-27 DIAGNOSIS — R109 Unspecified abdominal pain: Secondary | ICD-10-CM | POA: Insufficient documentation

## 2019-06-27 DIAGNOSIS — K219 Gastro-esophageal reflux disease without esophagitis: Secondary | ICD-10-CM | POA: Insufficient documentation

## 2019-06-27 DIAGNOSIS — R1032 Left lower quadrant pain: Secondary | ICD-10-CM | POA: Insufficient documentation

## 2019-06-27 NOTE — Progress Notes (Signed)
Patient: Marisa Lewis  Service Category: E/M  Provider: Gaspar Cola, MD  DOB: September 15, 1965  DOS: 06/28/2019  Referring Provider: Pearletha Alfred, PA  MRN: [id number]  Setting: Ambulatory outpatient  PCP: Pearletha Alfred, PA  Type: New Patient  Specialty: Interventional Pain Management    Location: Office  Delivery: Face-to-face     Primary Reason(s) for Visit: Encounter for initial evaluation of one or more chronic problems (new to examiner) potentially causing chronic pain, and posing a threat to normal musculoskeletal function. (Level of risk: High) CC: Back Pain  HPI  Marisa Lewis is a 54 y.o. year old, female patient, who comes today to see [place] for the first time for an initial evaluation of her chronic pain. She has Rectal bleeding; Polyp of sigmoid colon; Internal hemorrhoids; Abdominal pain; Gastroesophageal reflux disease; Left lower quadrant pain; Morbid obesity ([facility]); Morbid obesity with BMI of 40.0-44.9, adult ([place]); Chronic pain syndrome; Pharmacologic therapy; Disorder of skeletal system; Problems influencing health status; Chronic lower extremity pain (1ry area of Pain) (Left); Chronic low back pain (2ry area of Pain) (Left) w/o sciatica; Lumbar facet joint syndrome (Bilateral) (L>R); Chronic sacroiliac joint pain (Right); DDD (degenerative disc disease), lumbar; Abnormal MRI, lumbar spine (05/21/2019); Lumbar central spinal stenosis, 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 Today she comes in for evaluation of her Back Pain  Pain Assessment: Location: Lower Back Radiating: Pain radiaties down left side to her knee Onset: More than a month ago Duration: Chronic pain Quality: Aching, Burning, Throbbing, Shooting Severity: 4 /10 (subjective, self-reported pain score)  Note: Reported level is compatible with observation.                          When using our objective Pain Scale, levels between 6 and 10/10 are said to belong in an emergency room, as it progressively worsens from a 6/10, described as severely limiting, requiring emergency care not usually available at an outpatient pain management facility. At a 6/10 level, communication becomes difficult and requires great effort. Assistance to reach the emergency department may be required. Facial flushing and profuse sweating along with potentially dangerous increases in heart rate and blood pressure will be evident. Effect on ADL: limits daily my daily activities Timing: Constant Modifying factors: heat, meds BP: 110/74  HR: 64  Onset and Duration: Gradual and Date of onset: 2 months Cause of pain: Unknown Severity: Getting worse, NAS-11 at its worse: 10/10, NAS-11 at its best: 2/10, NAS-11 now: 4/10 and NAS-11 on the average: 4/10 Timing: Not influenced by the time of the day Aggravating Factors: Walking, Walking uphill and Walking downhill Alleviating Factors: Hot packs and Medications Associated Problems: Fatigue, Pain that wakes patient up and Pain that does not allow patient to sleep Quality of Pain: Burning, Constant, Shooting and Stabbing Previous Examinations or Tests: MRI scan and X-rays Previous Treatments: Narcotic medications and Steroid treatments by mouth  The patient comes into the clinics today for the first time for a chronic pain management evaluation.  According to the patient the worst pain is that of the left lower extremity, followed by the low back pain.  She also states that initially was the opposite.  Currently she states that the primary area of pain is that of the left lower extremity over the anterior aspect of her thigh and going down to the knee.  She indicates  that this feels like a burning pain.  She denies any prior surgeries, nerve blocks, x-rays, or physical therapy for this leg pain.   The patient's secondary area pain is that of the  left lower back.  Again she denies any surgeries, nerve blocks, but she did have an MRI done in [place].  She also denies any physical therapy in the last 3 years.  The lumbar MRI was done around 05/21/2019.  Physical exam today was significant for bilateral lumbar facet pain, right-sided sacroiliac joint pain, and some pain in the area of the left knee.  The patient did have some difficulties with balance, but she was able to toe walk and heel walk without any major problems.  She also had adequate strength of all lower extremity muscles including hip flexion, bilaterally.  Today I took the time to provide the patient with information regarding my pain practice. The patient was informed that my practice is divided into two sections: an interventional pain management section, as well as a completely separate and distinct medication management section. I explained that I have procedure days for my interventional therapies, and evaluation days for follow-ups and medication management. Because of the amount of documentation required during both, they are kept separated. This means that there is the possibility that she may be scheduled for a procedure on one day, and medication management the next. I have also informed her that because of staffing and facility limitations, I no longer take patients for medication management only. To illustrate the reasons for this, I gave the patient the example of surgeons, and how inappropriate it would be to refer a patient to his/her care, just to write for the post-surgical antibiotics on a surgery done by a different surgeon.   Because interventional pain management is my board-certified specialty, the patient was informed that joining my practice means that they are open to any and all interventional therapies. I made it clear that this does not mean that they will be forced to have any procedures done. What this means is that I believe interventional therapies to be  essential part of the diagnosis and proper management of chronic pain conditions. Therefore, patients not interested in these interventional alternatives will be better served under the care of a different practitioner.  The patient was also made aware of my Comprehensive Pain Management Safety Guidelines where by joining my practice, they limit all of their nerve blocks and joint injections to those done by our practice, for as long as we are retained to manage their care.   Historic Controlled Substance Pharmacotherapy Review  PMP and historical list of controlled substances: Tramadol 50 mg; hydrocodone/APAP 5/325; oxycodone 5 mg Highest opioid analgesic regimen found: Oxycodone IR 5 mg, 5 tablets/day (25 mg of oxycodone per day) (37.5 MME) Most recent opioid analgesic: Tramadol 50 mg, 1 tablet p.o. 4 times daily (200 mg/day of tramadol) (20 MME/day) Current opioid analgesics:  None Highest recorded MME/day: 37.5 mg/day MME/day: 0 mg/day  Medications: The patient did not bring the medication(s) to the appointment, as requested in our "New Patient Package" Pharmacodynamics: Desired effects: Analgesia: The patient reports >50% benefit. Reported improvement in function: The patient reports medication allows her to accomplish basic ADLs. Clinically meaningful improvement in function (CMIF): Sustained CMIF goals met Perceived effectiveness: Described as relatively effective, allowing for increase in activities of daily living (ADL) Undesirable effects: Side-effects or Adverse reactions: None reported Historical Monitoring: The patient  reports no history of drug use. List of all  UDS Test(s): No results found. List of other Serum/Urine Drug Screening Test(s):  No results found. Historical Background Evaluation: [place] PMP: PDMP reviewed during this encounter. Six (6) year initial data search conducted.             PMP NARX Score Report:  Narcotic: 230 Sedative: 100 Stimulant: 000   Department of public safety, offender search: Editor, commissioning Information) Non-contributory Risk Assessment Profile: Aberrant behavior: None observed or detected today Risk factors for fatal opioid overdose: None identified today PMP NARX Overdose Risk Score: 190 Fatal overdose hazard ratio (HR): Calculation deferred Non-fatal overdose hazard ratio (HR): Calculation deferred Risk of opioid abuse or dependence: 0.7-3.0% with doses ? 36 MME/day and 6.1-26% with doses ? 120 MME/day. Substance use disorder (SUD) risk level: See below Personal History of Substance Abuse (SUD-Substance use disorder):  Alcohol: Negative  Illegal Drugs: Negative  Rx Drugs: Negative  ORT Risk Level calculation: Low Risk  Opioid Risk Tool - 06/28/19 1018      Family History of Substance Abuse   Alcohol Positive Female    Illegal Drugs Negative    Rx Drugs Negative      Personal History of Substance Abuse   Alcohol Negative    Illegal Drugs Negative    Rx Drugs Negative      Age   Age between 43-45 years  No      History of Preadolescent Sexual Abuse   History of Preadolescent Sexual Abuse Negative or Female      Psychological Disease   Psychological Disease Negative    Depression Negative      Total Score   Opioid Risk Tool Scoring 3    Opioid Risk Interpretation Low Risk          ORT Scoring interpretation table:  Score <3 = Low Risk for SUD  Score between 4-7 = Moderate Risk for SUD  Score >8 = High Risk for Opioid Abuse   PHQ-2 Depression Scale:  Total score:    PHQ-2 Scoring interpretation table: (Score and probability of major depressive disorder)  Score 0 = No depression  Score 1 = 15.4% Probability  Score 2 = 21.1% Probability  Score 3 = 38.4% Probability  Score 4 = 45.5% Probability  Score 5 = 56.4% Probability  Score 6 = 78.6% Probability   PHQ-9 Depression Scale:  Total score:    PHQ-9 Scoring interpretation table:  Score 0-4 = No depression  Score 5-9 = Mild depression  Score  10-14 = Moderate depression  Score 15-19 = Moderately severe depression  Score 20-27 = Severe depression (2.4 times higher risk of SUD and 2.89 times higher risk of overuse)   Pharmacologic Plan: As per protocol, I have not taken over any controlled substance management, pending the results of ordered tests and/or consults.            Initial impression: Pending review of available data and ordered tests.  Meds   Current Outpatient Medications:  .  albuterol (PROVENTIL HFA;VENTOLIN HFA) 108 (90 Base) MCG/ACT inhaler, Inhale into the lungs every 6 (six) hours as needed for wheezing or shortness of breath., Disp: , Rfl:  .  cetirizine (ZYRTEC) 10 MG tablet, Take 10 mg by mouth daily., Disp: , Rfl:  .  cyclobenzaprine (FLEXERIL) 10 MG tablet, Take 10 mg by mouth 3 (three) times daily as needed for muscle spasms., Disp: , Rfl:  .  eletriptan (RELPAX) 40 MG tablet, Take 40 mg by mouth as needed for migraine or headache. May  repeat in 2 hours if headache persists or recurs., Disp: , Rfl:  .  ferrous sulfate 325 (65 FE) MG tablet, Take 325 mg by mouth daily with breakfast., Disp: , Rfl:  .  fluticasone (FLONASE) 50 MCG/ACT nasal spray, Place 2 sprays into both nostrils daily., Disp: , Rfl:  .  gabapentin (NEURONTIN) 300 MG capsule, Take 300 mg by mouth 3 (three) times daily., Disp: , Rfl:  .  omeprazole (PRILOSEC) 40 MG capsule, Take 40 mg by mouth daily., Disp: , Rfl:  .  Promethazine HCl (PHENERGAN PO), Take 10 mg by mouth., Disp: , Rfl:  .  SAXENDA 18 MG/3ML SOPN, , Disp: , Rfl:  .  SUMAtriptan (IMITREX) 6 MG/0.5ML SOLN injection, Inject 6 mg into the skin every 2 (two) hours as needed for migraine or headache. May repeat in 2 hours if headache persists or recurs., Disp: , Rfl:  .  topiramate (TOPAMAX) 100 MG tablet, Take 100 mg by mouth 2 (two) times daily., Disp: , Rfl:  .  traMADol (ULTRAM) 50 MG tablet, Take 1 tablet (50 mg total) by mouth every 8 (eight) hours as needed., Disp: 10 tablet, Rfl:  0 .  vitamin B-12 (CYANOCOBALAMIN) 1000 MCG tablet, Take 2,000 mcg by mouth daily., Disp: , Rfl:  .  Vitamin D, Ergocalciferol, (DRISDOL) 1.25 MG (50000 UT) CAPS capsule, , Disp: , Rfl:  .  zolpidem (AMBIEN) 5 MG tablet, Take 5 mg by mouth at bedtime as needed for sleep., Disp: , Rfl:   Imaging Review   Complexity Note: No results found under the [place] record.                        ROS  Cardiovascular: No reported cardiovascular signs or symptoms such as High blood pressure, coronary artery disease, abnormal heart rate or rhythm, heart attack, blood thinner therapy or heart weakness and/or failure Pulmonary or Respiratory: Wheezing and difficulty taking a deep full breath (Asthma) and Snoring  Neurological: No reported neurological signs or symptoms such as seizures, abnormal skin sensations, urinary and/or fecal incontinence, being born with an abnormal open spine and/or a tethered spinal cord Psychological-Psychiatric: Depressed Gastrointestinal: Reflux or heatburn Genitourinary: No reported renal or genitourinary signs or symptoms such as difficulty voiding or producing urine, peeing blood, non-functioning kidney, kidney stones, difficulty emptying the bladder, difficulty controlling the flow of urine, or chronic kidney disease Hematological: No reported hematological signs or symptoms such as prolonged bleeding, low or poor functioning platelets, bruising or bleeding easily, hereditary bleeding problems, low energy levels due to low hemoglobin or being anemic Endocrine: No reported endocrine signs or symptoms such as high or low blood sugar, rapid heart rate due to high thyroid levels, obesity or weight gain due to slow thyroid or thyroid disease Rheumatologic: No reported rheumatological signs and symptoms such as fatigue, joint pain, tenderness, swelling, redness, heat, stiffness, decreased range of motion, with or without associated rash Musculoskeletal:  Negative for myasthenia gravis, muscular dystrophy, multiple sclerosis or malignant hyperthermia Work History: Working full time  Allergies  Marisa Lewis is allergic to penicillins, asa [aspirin], and morphine and related.  Laboratory Chemistry Profile   Renal Lab Results  Component Value Date   BUN 16 06/28/2019   CREATININE 0.78 06/28/2019   GFRAA >60 06/28/2019   GFRNONAA >60 06/28/2019     Electrolytes Lab Results  Component Value Date   NA 139 06/28/2019   K 3.6 06/28/2019   CL 103 06/28/2019  CALCIUM 9.1 06/28/2019   MG 2.4 06/28/2019     Hepatic Lab Results  Component Value Date   AST 22 06/28/2019   ALT 15 06/28/2019   ALBUMIN 4.0 06/28/2019   ALKPHOS 55 06/28/2019     ID Lab Results  Component Value Date   SARSCOV2NAA NOT DETECTED 06/12/2018     Bone No results found for: [place], KW409BD5HGD, JM4268TM1, [id number], 25OHVITD1, 25OHVITD2, 25OHVITD3, TESTOFREE, TESTOSTERONE   Endocrine Lab Results  Component Value Date   GLUCOSE 88 06/28/2019     Neuropathy No results found for: VITAMINB12, FOLATE, HGBA1C, HIV   CNS No results found for: COLORCSF, APPEARCSF, RBCCOUNTCSF, WBCCSF, POLYSCSF, LYMPHSCSF, EOSCSF, PROTEINCSF, GLUCCSF, JCVIRUS, CSFOLI, IGGCSF, LABACHR, ACETBL, LABACHR, ACETBL   Inflammation (CRP: Acute  ESR: Chronic) Lab Results  Component Value Date   ESRSEDRATE 9 06/28/2019     Rheumatology No results found for: RF, ANA, LABURIC, URICUR, LYMEIGGIGMAB, LYMEABIGMQN, HLAB27   Coagulation No results found for: INR, LABPROT, APTT, PLT, DDIMER, LABHEMA, VITAMINK1, AT3   Cardiovascular Lab Results  Component Value Date   HGB 13.5 04/06/2018     Screening Lab Results  Component Value Date   SARSCOV2NAA NOT DETECTED 06/12/2018   COVIDSOURCE NASOPHARYNGEAL 06/12/2018     Cancer No results found for: CEA, CA125, LABCA2   Allergens No results found for: ALMOND, APPLE, ASPARAGUS, AVOCADO, BANANA, BARLEY, BASIL, BAYLEAF, GREENBEAN,  LIMABEAN, WHITEBEAN, BEEFIGE, REDBEET, BLUEBERRY, BROCCOLI, CABBAGE, MELON, CARROT, CASEIN, CASHEWNUT, CAULIFLOWER, CELERY     Note: Lab results reviewed.   [place]  Drug: Marisa Lewis  reports no history of drug use. Alcohol:  reports no history of alcohol use. Tobacco:  reports that she quit smoking about 4 years ago. She has never used smokeless tobacco. Medical:  has a past medical history of Asthma, GERD (gastroesophageal reflux disease), Migraine, and Sciatica of left side. Family: family history includes COPD in her mother; Diabetes in her father and mother; Hyperlipidemia in her mother; Kidney failure in her mother.  Past Surgical History:  Procedure Laterality Date  . ABDOMINAL HYSTERECTOMY    . COLONOSCOPY WITH PROPOFOL N/A 06/16/2018   Procedure: COLONOSCOPY WITH BIOPSIES;  Surgeon: Virgel Manifold, MD;  Location: [facility];  Service: Endoscopy;  Laterality: N/A;  . GASTRIC BYPASS  1997 and 2005  . POLYPECTOMY N/A 06/16/2018   Procedure: POLYPECTOMY;  Surgeon: Virgel Manifold, MD;  Location: [facility];  Service: Endoscopy;  Laterality: N/A;  . SPLENECTOMY, TOTAL    . UMBILICAL HERNIA REPAIR     Active Ambulatory Problems    Diagnosis Date Noted  . Rectal bleeding   . Polyp of sigmoid colon   . Internal hemorrhoids   . Abdominal pain 06/27/2019  . Gastroesophageal reflux disease 06/27/2019  . Left lower quadrant pain 06/27/2019  . Morbid obesity ([place]) 06/27/2019  . Morbid obesity with BMI of 40.0-44.9, adult ([place]) 03/12/2018  . Chronic pain syndrome 06/28/2019  . Pharmacologic therapy 06/28/2019  . Disorder of skeletal system 06/28/2019  . Problems influencing health status 06/28/2019  . Chronic lower extremity pain (1ry area of Pain) (Left) 06/28/2019  . Chronic low back pain (2ry area of Pain) (Left) w/o sciatica 06/28/2019  . Lumbar facet joint syndrome (Bilateral) (L>R) 06/28/2019  . Chronic sacroiliac joint pain (Right) 06/28/2019  . DDD  (degenerative disc disease), lumbar 06/28/2019  . Abnormal MRI, lumbar spine (05/21/2019) 06/28/2019  . 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 Lumbosacral lateral recess stenosis (L5-S1) (Right)  06/28/2019  . Lumbosacral facet arthropathy (L4-5) (R>L) 06/28/2019  . 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/28/2019   Resolved Ambulatory Problems    Diagnosis Date Noted  . No Resolved Ambulatory Problems   Past Medical History:  Diagnosis Date  . Asthma   . GERD (gastroesophageal reflux disease)   . Migraine   . Sciatica of left side    Constitutional Exam  General appearance: Well nourished, well developed, and well hydrated. In no apparent acute distress Vitals:   06/28/19 1001  BP: 110/74  Pulse: 64  Temp: 97.7 F (36.5 C)  SpO2: 100%  Weight: 238 lb (108 kg)  Height: _0  (1.549 m)   BMI Assessment: Estimated body mass index is 44.97 kg/m as calculated from the following:   Height as of this encounter: _1  (1.549 m).   Weight as of this encounter: 238 lb (108 kg).  BMI interpretation table: BMI level Category Range association with higher incidence of chronic pain  <18 kg/m2 Underweight   18.5-24.9 kg/m2 Ideal body weight   25-29.9 kg/m2 Overweight Increased incidence by 20%  30-34.9 kg/m2 Obese (Class I) Increased incidence by 68%  35-39.9 kg/m2 Severe obesity (Class II) Increased incidence by 136%  >40 kg/m2 Extreme obesity (Class III) Increased incidence by 254%   Patient's current BMI Ideal Body weight  Body mass index is 44.97 kg/m. Ideal body weight: 47.8 kg (105 lb 6.1 oz) Adjusted ideal body weight: 71.9 kg (158 lb 6.8 oz)   BMI Readings from Last 4 Encounters:  06/28/19 44.97 kg/m  07/15/18 42.70 kg/m  06/16/18 43.84 kg/m  11/16/17 41.57 kg/m   Wt Readings from Last 4 Encounters:  06/28/19 238 lb (108 kg)  07/15/18 226 lb (102.5 kg)  06/16/18 232 lb (105.2 kg)  11/16/17 220 lb (99.8 kg)    Psych/Mental  status: Alert, oriented x 3 (person, place, & time)       Eyes: PERLA Respiratory: No evidence of acute respiratory distress  Cervical Spine Exam  Skin & Axial Inspection: No masses, redness, edema, swelling, or associated skin lesions Alignment: Symmetrical Functional ROM: Unrestricted ROM      Stability: No instability detected Muscle Tone/Strength: Functionally intact. No obvious neuro-muscular anomalies detected. Sensory (Neurological): Unimpaired Palpation: No palpable anomalies              Upper Extremity (UE) Exam    Side: Right upper extremity  Side: Left upper extremity  Skin & Extremity Inspection: Skin color, temperature, and hair growth are WNL. No peripheral edema or cyanosis. No masses, redness, swelling, asymmetry, or associated skin lesions. No contractures.  Skin & Extremity Inspection: Skin color, temperature, and hair growth are WNL. No peripheral edema or cyanosis. No masses, redness, swelling, asymmetry, or associated skin lesions. No contractures.  Functional ROM: Unrestricted ROM          Functional ROM: Unrestricted ROM          Muscle Tone/Strength: Functionally intact. No obvious neuro-muscular anomalies detected.  Muscle Tone/Strength: Functionally intact. No obvious neuro-muscular anomalies detected.  Sensory (Neurological): Unimpaired          Sensory (Neurological): Unimpaired          Palpation: No palpable anomalies              Palpation: No palpable anomalies              Provocative Test(s):  Phalen's test: deferred Tinel's test: deferred Apley's scratch test (touch opposite shoulder):  Action 1 (Across chest): deferred Action  2 (Overhead): deferred Action 3 (LB reach): deferred   Provocative Test(s):  Phalen's test: deferred Tinel's test: deferred Apley's scratch test (touch opposite shoulder):  Action 1 (Across chest): deferred Action 2 (Overhead): deferred Action 3 (LB reach): deferred    Thoracic Spine Area Exam  Skin & Axial Inspection: No  masses, redness, or swelling Alignment: Symmetrical Functional ROM: Unrestricted ROM Stability: No instability detected Muscle Tone/Strength: Functionally intact. No obvious neuro-muscular anomalies detected. Sensory (Neurological): Unimpaired Muscle strength & Tone: No palpable anomalies  Lumbar Exam  Skin & Axial Inspection: No masses, redness, or swelling Alignment: Symmetrical Functional ROM: Unrestricted ROM       Stability: No instability detected Muscle Tone/Strength: Functionally intact. No obvious neuro-muscular anomalies detected. Sensory (Neurological): Unimpaired Palpation: No palpable anomalies       Provocative Tests: Hyperextension/rotation test: (+) bilaterally for facet joint pain. (L>R) Lumbar quadrant test (Kemp's test): (+) bilaterally for facet joint pain. (L>R) Lateral bending test: (-)       Patrick's Maneuver: (+) for right-sided S-I arthralgia             FABER* test: (+) for right-sided S-I arthralgia             S-I anterior distraction/compression test: deferred today         S-I lateral compression test: deferred today         S-I Thigh-thrust test: deferred today         S-I Gaenslen's test: deferred today         *(Flexion, ABduction and External Rotation)  Gait & Posture Assessment  Ambulation: Unassisted Gait: Modified gait pattern (slower gait speed, wider stride width, and longer stance duration) associated with morbid obesity Posture: WNL   Lower Extremity Exam    Side: Right lower extremity  Side: Left lower extremity  Stability: No instability observed          Stability: No instability observed          Skin & Extremity Inspection: Skin color, temperature, and hair growth are WNL. No peripheral edema or cyanosis. No masses, redness, swelling, asymmetry, or associated skin lesions. No contractures.  Skin & Extremity Inspection: Skin color, temperature, and hair growth are WNL. No peripheral edema or cyanosis. No masses, redness, swelling,  asymmetry, or associated skin lesions. No contractures.  Functional ROM: Decreased ROM for hip and knee joints Limited SLR (straight leg raise), but negative  Functional ROM: Decreased ROM for hip and knee joints Limited SLR (straight leg raise), but negative  Muscle Tone/Strength: Able to Toe-walk & Heel-walk without problems  Muscle Tone/Strength: Able to Toe-walk & Heel-walk without problems  Sensory (Neurological): Dermatomal pain pattern L3  Sensory (Neurological): Unimpaired        DTR: Patellar: 0: absent Achilles: 1+: trace Plantar: deferred today  DTR: Patellar: 0: absent Achilles: 1+: trace Plantar: deferred today  Palpation: No palpable anomalies  Palpation: No palpable anomalies   Assessment  Primary Diagnosis & Pertinent Problem List: The primary encounter diagnosis was Chronic pain syndrome. 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  Visit Diagnosis (New problems to examiner): 1. Chronic pain syndrome   2. Chronic lower extremity pain (1ry area of Pain) (Left)   3. 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 Lumbosacral lateral recess stenosis (L5-S1) (Right)   5. 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 Chronic low back pain (2ry area of Pain) (Left) w/o sciatica   7. DDD (degenerative disc disease), lumbar   8. Lumbar facet joint syndrome (Bilateral) (L>R)   9. Lumbosacral  facet arthropathy (L4-5) (R>L)   10. Chronic sacroiliac joint pain (Right)   11. Abnormal MRI, lumbar spine (05/21/2019)   12. Pharmacologic therapy   13. Disorder of skeletal system   14. Problems influencing health status    Plan of Care (Initial workup plan)  Note: Marisa Lewis was reminded that as per protocol, today's visit has been an evaluation only. We have not taken over the patient's controlled substance management.  Problem-specific plan: No problem-specific Assessment & Plan notes found for this encounter.   Lab Orders     ToxASSURE Select 13 (MW), Urine     Comp. Metabolic Panel (12)     Magnesium     Vitamin B12     Sedimentation rate     25-Hydroxy vitamin D Lcms D2+D3     C-reactive protein  Imaging Orders     DG Lumbar Spine Complete W/Bend Referral Orders  No referral(s) requested today   Procedure Orders    No procedure(s) ordered today   Pharmacotherapy (current): Medications ordered:  No orders of the defined types were placed in this encounter.  Medications administered during this visit: Rayetta Pigg "[facility]" had no medications administered during this visit.   Pharmacological management options:  Opioid Analgesics: The patient was informed that there is no guarantee that she would be a candidate for opioid analgesics. The decision will be made following CDC guidelines. This decision will be based on the results of diagnostic studies, as well as Marisa Lewis risk profile.   Membrane stabilizer: To be determined at a later time  Muscle relaxant: To be determined at a later time  NSAID: To be determined at a later time  Other analgesic(s): To be determined at a later time   Interventional management options: Marisa Lewis was informed that there is no guarantee that she would be a candidate for interventional therapies. The decision will be based on the results of diagnostic studies, as well as Marisa Lewis risk profile.  Procedure(s) under  consideration:  Diagnostic left-sided L4-5 LESI #1  Diagnostic left-sided lumbar facet block #1    Provider-requested follow-up: Return for (F2F), (2V).  Future Appointments  Date Time Provider [place]  07/26/2019  9:00 AM Milinda Pointer, MD [facility] None    Note by: Gaspar Cola, MD Date: 06/28/2019; Time: 4:09 PM

## 2019-06-28 ENCOUNTER — Ambulatory Visit
Admission: RE | Admit: 2019-06-28 | Discharge: 2019-06-28 | Disposition: A | Discharge status: Home / Self Care | Payer: Managed Care, Other (non HMO) | Source: Ambulatory Visit | Attending: Pain Medicine | Admitting: Pain Medicine

## 2019-06-28 ENCOUNTER — Other Ambulatory Visit
Admission: RE | Admit: 2019-06-28 | Discharge: 2019-06-28 | Disposition: A | Discharge status: Home / Self Care | Payer: Managed Care, Other (non HMO) | Source: Ambulatory Visit | Attending: Pain Medicine | Admitting: Pain Medicine

## 2019-06-28 ENCOUNTER — Other Ambulatory Visit: Payer: Self-pay

## 2019-06-28 ENCOUNTER — Encounter: Payer: Self-pay | Admitting: Pain Medicine

## 2019-06-28 ENCOUNTER — Ambulatory Visit: Payer: Managed Care, Other (non HMO) | Admitting: Pain Medicine

## 2019-06-28 VITALS — BP 110/74 | HR 64 | Temp 97.7°F | Ht 61.0 in | Wt 238.0 lb

## 2019-06-28 DIAGNOSIS — M545 Low back pain, unspecified: Secondary | ICD-10-CM | POA: Insufficient documentation

## 2019-06-28 DIAGNOSIS — M48062 Spinal stenosis, lumbar region with neurogenic claudication: Secondary | ICD-10-CM | POA: Insufficient documentation

## 2019-06-28 DIAGNOSIS — R937 Abnormal findings on diagnostic imaging of other parts of musculoskeletal system: Secondary | ICD-10-CM | POA: Insufficient documentation

## 2019-06-28 DIAGNOSIS — G8929 Other chronic pain: Secondary | ICD-10-CM | POA: Insufficient documentation

## 2019-06-28 DIAGNOSIS — M79605 Pain in left leg: Secondary | ICD-10-CM | POA: Insufficient documentation

## 2019-06-28 DIAGNOSIS — Z789 Other specified health status: Secondary | ICD-10-CM | POA: Insufficient documentation

## 2019-06-28 DIAGNOSIS — M4807 Spinal stenosis, lumbosacral region: Secondary | ICD-10-CM | POA: Insufficient documentation

## 2019-06-28 DIAGNOSIS — Z79899 Other long term (current) drug therapy: Secondary | ICD-10-CM | POA: Insufficient documentation

## 2019-06-28 DIAGNOSIS — G894 Chronic pain syndrome: Secondary | ICD-10-CM | POA: Insufficient documentation

## 2019-06-28 DIAGNOSIS — M5136 Other intervertebral disc degeneration, lumbar region: Secondary | ICD-10-CM | POA: Insufficient documentation

## 2019-06-28 DIAGNOSIS — M47817 Spondylosis without myelopathy or radiculopathy, lumbosacral region: Secondary | ICD-10-CM | POA: Insufficient documentation

## 2019-06-28 DIAGNOSIS — M47816 Spondylosis without myelopathy or radiculopathy, lumbar region: Secondary | ICD-10-CM | POA: Insufficient documentation

## 2019-06-28 DIAGNOSIS — M899 Disorder of bone, unspecified: Secondary | ICD-10-CM

## 2019-06-28 DIAGNOSIS — D1779 Benign lipomatous neoplasm of other sites: Secondary | ICD-10-CM

## 2019-06-28 DIAGNOSIS — M533 Sacrococcygeal disorders, not elsewhere classified: Secondary | ICD-10-CM

## 2019-06-28 LAB — COMPREHENSIVE METABOLIC PANEL
ALT: 15 U/L (ref 0–44)
AST: 22 U/L (ref 15–41)
Albumin: 4 g/dL (ref 3.5–5.0)
Alkaline Phosphatase: 55 U/L (ref 38–126)
Anion gap: 7 (ref 5–15)
BUN: 16 mg/dL (ref 6–20)
CO2: 29 mmol/L (ref 22–32)
Calcium: 9.1 mg/dL (ref 8.9–10.3)
Chloride: 103 mmol/L (ref 98–111)
Creatinine, Ser: 0.78 mg/dL (ref 0.44–1.00)
GFR calc Af Amer: >60 mL/min (ref >60)
GFR calc non Af Amer: >60 mL/min (ref >60)
Glucose, Bld: 88 mg/dL (ref 70–99)
Potassium: 3.6 mmol/L (ref 3.5–5.1)
Sodium: 139 mmol/L (ref 135–145)
Total Bilirubin: 0.8 mg/dL (ref 0.3–1.2)
Total Protein: 7 g/dL (ref 6.5–8.1)

## 2019-06-28 LAB — SEDIMENTATION RATE: Sed Rate: 9 mm/hr (ref 0–30)

## 2019-06-28 LAB — MAGNESIUM: Magnesium: 2.4 mg/dL (ref 1.7–2.4)

## 2019-06-28 LAB — VITAMIN B12: Vitamin B-12: 5638 pg/mL — ABNORMAL HIGH (ref 180–914)

## 2019-06-28 LAB — C-REACTIVE PROTEIN: CRP: 0.6 mg/dL (ref <1.0)

## 2019-06-28 NOTE — Patient Instructions (Signed)
____________________________________________________________________________________________  General Risks and Possible Complications  Patient Responsibilities: It is important that you read this as it is part of your informed consent. It is our duty to inform you of the risks and possible complications associated with treatments offered to you. It is your responsibility as a patient to read this and to ask questions about anything that is not clear or that you believe was not covered in this document.  Patient's Rights: You have the right to refuse treatment. You also have the right to change your mind, even after initially having agreed to have the treatment done. However, under this last option, if you wait until the last second to change your mind, you may be charged for the materials used up to that point.  Introduction: Medicine is not an exact science. Everything in Medicine, including the lack of treatment(s), carries the potential for danger, harm, or loss (which is by definition: Risk). In Medicine, a complication is a secondary problem, condition, or disease that can aggravate an already existing one. All treatments carry the risk of possible complications. The fact that a side effects or complications occurs, does not imply that the treatment was conducted incorrectly. It must be clearly understood that these can happen even when everything is done following the highest safety standards.  No treatment: You can choose not to proceed with the proposed treatment alternative. The "PRO(s)" would include: avoiding the risk of complications associated with the therapy. The "CON(s)" would include: not getting any of the treatment benefits. These benefits fall under one of three categories: diagnostic; therapeutic; and/or palliative. Diagnostic benefits include: getting information which can ultimately lead to improvement of the disease or symptom(s). Therapeutic benefits are those associated with the  successful treatment of the disease. Finally, palliative benefits are those related to the decrease of the primary symptoms, without necessarily curing the condition (example: decreasing the pain from a flare-up of a chronic condition, such as incurable terminal cancer).  General Risks and Complications: These are associated to most interventional treatments. They can occur alone, or in combination. They fall under one of the following six (6) categories: no benefit or worsening of symptoms; bleeding; infection; nerve damage; allergic reactions; and/or death. 1. No benefits or worsening of symptoms: In Medicine there are no guarantees, only probabilities. No healthcare provider can ever guarantee that a medical treatment will work, they can only state the probability that it may. Furthermore, there is always the possibility that the condition may worsen, either directly, or indirectly, as a consequence of the treatment. 2. Bleeding: This is more common if the patient is taking a blood thinner, either prescription or over the counter (example: Goody Powders, Fish oil, Aspirin, Garlic, etc.), or if suffering a condition associated with impaired coagulation (example: Hemophilia, cirrhosis of the liver, low platelet counts, etc.). However, even if you do not have one on these, it can still happen. If you have any of these conditions, or take one of these drugs, make sure to notify your treating physician. 3. Infection: This is more common in patients with a compromised immune system, either due to disease (example: diabetes, cancer, human immunodeficiency virus [HIV], etc.), or due to medications or treatments (example: therapies used to treat cancer and rheumatological diseases). However, even if you do not have one on these, it can still happen. If you have any of these conditions, or take one of these drugs, make sure to notify your treating physician. 4. Nerve Damage: This is more common when the   treatment is  an invasive one, but it can also happen with the use of medications, such as those used in the treatment of cancer. The damage can occur to small secondary nerves, or to large primary ones, such as those in the spinal cord and brain. This damage may be temporary or permanent and it may lead to impairments that can range from temporary numbness to permanent paralysis and/or brain death. 5. Allergic Reactions: Any time a substance or material comes in contact with our body, there is the possibility of an allergic reaction. These can range from a mild skin rash (contact dermatitis) to a severe systemic reaction (anaphylactic reaction), which can result in death. 6. Death: In general, any medical intervention can result in death, most of the time due to an unforeseen complication. ____________________________________________________________________________________________   

## 2019-06-28 NOTE — Progress Notes (Signed)
Safety precautions to be maintained throughout the outpatient stay will include: orient to surroundings, keep bed in low position, maintain call bell within reach at all times, provide assistance with transfer out of bed and ambulation.  

## 2019-06-29 LAB — VITAMIN D 25 HYDROXY (VIT D DEFICIENCY, FRACTURES): Vit D, 25-Hydroxy: 73.07 ng/mL (ref 30–100)

## 2019-07-02 LAB — TOXASSURE SELECT 13 (MW), URINE

## 2019-07-26 ENCOUNTER — Ambulatory Visit: Payer: Managed Care, Other (non HMO) | Admitting: Pain Medicine

## 2019-07-26 ENCOUNTER — Other Ambulatory Visit: Payer: Self-pay

## 2019-07-26 NOTE — Progress Notes (Deleted)
Rescheduled

## 2019-07-27 NOTE — Progress Notes (Signed)
PROVIDER NOTE: Information contained herein reflects review and annotations entered in association with encounter. Interpretation of such information and data should be left to medically-trained personnel. Information provided to patient can be located elsewhere in the medical record under "Patient Instructions". Document created using STT-dictation technology, any transcriptional errors that may result from process are unintentional.    Patient: Marisa Lewis  Service Category: E/M  Provider: Gaspar Cola, MD  DOB: Dec 26, 1965  DOS: 07/28/2019  Specialty: Interventional Pain Management  MRN: [id number]  Setting: Ambulatory outpatient  PCP: Pearletha Alfred, PA  Type: Established Patient    Referring Provider: Pearletha Alfred, PA  Location: Office  Delivery: Face-to-face     Primary Reason(s) for Visit: Encounter for evaluation before starting new chronic pain management plan of care (Level of risk: moderate) CC: Back Pain (lumbar bilateral, left is worse )  HPI  Marisa Lewis is a 54 y.o. year old, female patient, who comes today for a follow-up evaluation to review the test results and decide on a treatment plan. She has Rectal bleeding; Polyp of sigmoid colon; Internal hemorrhoids; Abdominal pain; Gastroesophageal reflux disease; Left lower quadrant pain; Morbid obesity ([place]); Morbid obesity with BMI of 40.0-44.9, adult ([place]); Chronic pain syndrome; Pharmacologic therapy; Disorder of skeletal system; Problems influencing health status; Chronic lower extremity pain (1ry area of Pain) (Left); Chronic low back pain (2ry area of Pain) (Left) w/o sciatica; Lumbar facet joint syndrome (Bilateral) (L>R); Chronic sacroiliac joint pain (Right); DDD (degenerative disc disease), lumbar; Abnormal MRI, lumbar spine (05/21/2019); Lumbar central spinal stenosis, 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 Her primarily concern today is the Back Pain (lumbar bilateral, left is worse )  Pain Assessment: Location: Left, Right, Lower Back Radiating: down the left leg to approx the knee Onset: More than a month ago Duration: Chronic pain Quality: Constant, Discomfort, Squeezing, Sharp, Stabbing, Shooting Severity: 4  (back is a 2 left leg is 4)/10 (subjective, self-reported pain score)  Note: Reported level is compatible with observation.                         When using our objective Pain Scale, levels between 6 and 10/10 are said to belong in an emergency room, as it progressively worsens from a 6/10, described as severely limiting, requiring emergency care not usually available at an outpatient pain management facility. At a 6/10 level, communication becomes difficult and requires great effort. Assistance to reach the emergency department may be required. Facial flushing and profuse sweating along with potentially dangerous increases in heart rate and blood pressure will be evident. Effect on ADL: takes longer to accomplish activities. Timing: Constant Modifying factors: heat, medications BP: (!) 81/63  HR: 64  Marisa Lewis comes in today for a follow-up visit after her initial evaluation on 07/26/2019. Today we went over the results of her tests. These were explained in "Layman's terms". During today's appointment we went over my diagnostic impression, as well as the proposed treatment plan.  According to the patient the worst pain is that of the left lower extremity, followed by the low back pain.  She also states that initially was the opposite.  Currently she states that the primary area of pain is that of the left lower extremity over the anterior aspect of her thigh and going down to the knee.  She indicates that this feels like  a burning pain.  She denied any prior surgeries, nerve blocks, x-rays, or physical therapy for this leg pain.   The patient's  secondary area pain is that of the left lower back.  Again she denies any surgeries, nerve blocks, but she did have an MRI done in [place].  She also denied any physical therapy in the last 3 years.  The lumbar MRI was done around 05/21/2019.  Physical exam today was significant for bilateral lumbar facet pain, right-sided sacroiliac joint pain, and some pain in the area of the left knee.  The patient did have some difficulties with balance, but she was able to toe walk and heel walk without any major problems.  She also had adequate strength of all lower extremity muscles including hip flexion, bilaterally.  In considering the treatment plan options, Marisa Lewis was reminded that I no longer take patients for medication management only. I asked her to let me know if she had no intention of taking advantage of the interventional therapies, so that we could make arrangements to provide this space to someone interested. I also made it clear that undergoing interventional therapies for the purpose of getting pain medications is very inappropriate on the part of a patient, and it will not be tolerated in this practice. This type of behavior would suggest true addiction and therefore it requires referral to an addiction specialist.   Further details on both, my assessment(s), as well as the proposed treatment plan, please see below.  Controlled Substance Pharmacotherapy Assessment REMS (Risk Evaluation and Mitigation Strategy)  Analgesic: None Highest recorded MME/day: 37.5 mg/day MME/day: 0 mg/day  Pill Count: None expected due to no prior prescriptions written by our practice. No notes on file Pharmacokinetics: Liberation and absorption (onset of action): WNL Distribution (time to peak effect): WNL Metabolism and excretion (duration of action): WNL         Pharmacodynamics: Desired effects: Analgesia: Marisa Lewis reports >50% benefit. Functional ability: Patient reports that medication allows her to  accomplish basic ADLs Clinically meaningful improvement in function (CMIF): Sustained CMIF goals met Perceived effectiveness: Described as relatively effective, allowing for increase in activities of daily living (ADL) Undesirable effects: Side-effects or Adverse reactions: None reported Monitoring:  PMP: PDMP reviewed during this encounter. Online review of the past 29-monthperiod previously conducted. Not applicable at this point since we have not taken over the patient's medication management yet. List of other Serum/Urine Drug Screening Test(s):  No results found. List of all UDS test(s) done:  Lab Results  Component Value Date   SUMMARY Note 06/26/2019   Last UDS on record: Summary  Date Value Ref Range Status  06/26/2019 Note  Final    Comment:    ==================================================================== [employer] Select 13 (MW) ==================================================================== Test                             Result       Flag       Units  Drug Present and Declared for Prescription Verification   Tramadol                       558          EXPECTED   ng/mg creat   O-Desmethyltramadol            3849         EXPECTED   ng/mg creat   N-Desmethyltramadol  472          EXPECTED   ng/mg creat    Source of tramadol is a prescription medication. O-desmethyltramadol    and N-desmethyltramadol are expected metabolites of tramadol.  ==================================================================== Test                      Result    Flag   Units      Ref Range   Creatinine              57               mg/dL      >=20 ==================================================================== Declared Medications:  The flagging and interpretation on this report are based on the  following declared medications.  Unexpected results may arise from  inaccuracies in the declared medications.   **Note: The testing scope of this panel includes  these medications:   Tramadol (Ultram)   **Note: The testing scope of this panel does not include the  following reported medications:   Albuterol (Ventolin HFA)  Cetirizine (Zyrtec)  Cyanocobalamin  Cyclobenzaprine (Flexeril)  Eletriptan (Relpax)  Fluticasone (Flonase)  Gabapentin (Neurontin)  Iron  Liraglutide (Saxenda)  Omeprazole (Prilosec)  Promethazine  Sumatriptan (Imitrex)  Topiramate (Topamax)  Vitamin D2 (Drisdol)  Zolpidem (Ambien) ==================================================================== For clinical consultation, please call [phone]. ====================================================================    UDS interpretation: No unexpected findings.          Medication Assessment Form: Patient introduced to form today Treatment compliance: Treatment may start today if patient agrees with proposed plan. Evaluation of compliance is not applicable at this point Risk Assessment Profile: Aberrant behavior: See initial evaluations. None observed or detected today Comorbid factors increasing risk of overdose: See initial evaluation. No additional risks detected today Opioid risk tool (ORT):  Opioid Risk  06/28/2019  Alcohol 3  Illegal Drugs 0  Rx Drugs 0  Alcohol 0  Illegal Drugs 0  Rx Drugs 0  Age between 16-45 years  0  History of Preadolescent Sexual Abuse 0  Psychological Disease 0  Depression 0  Opioid Risk Tool Scoring 3  Opioid Risk Interpretation Low Risk    ORT Scoring interpretation table:  Score <3 = Low Risk for SUD  Score between 4-7 = Moderate Risk for SUD  Score >8 = High Risk for Opioid Abuse   Risk of substance use disorder (SUD): Low  Risk Mitigation Strategies:  Patient opioid safety counseling: Completed today. Counseling provided to patient as per "Patient Counseling Document". Document signed by patient, attesting to counseling and understanding Patient-Prescriber Agreement (PPA): Obtained today.  Controlled  substance notification to other providers: Written and sent today.  Pharmacologic Plan: Today we may be taking over the patient's pharmacological regimen. See below.             Laboratory Chemistry Profile   Renal Lab Results  Component Value Date   BUN 16 06/28/2019   CREATININE 0.78 06/28/2019   GFRAA >60 06/28/2019   GFRNONAA >60 06/28/2019     Electrolytes Lab Results  Component Value Date   NA 139 06/28/2019   K 3.6 06/28/2019   CL 103 06/28/2019   CALCIUM 9.1 06/28/2019   MG 2.4 06/28/2019     Hepatic Lab Results  Component Value Date   AST 22 06/28/2019   ALT 15 06/28/2019   ALBUMIN 4.0 06/28/2019   ALKPHOS 55 06/28/2019     ID Lab Results  Component Value Date   SARSCOV2NAA NOT DETECTED 06/12/2018  Bone Lab Results  Component Value Date   VD25OH 73.07 06/28/2019     Endocrine Lab Results  Component Value Date   GLUCOSE 88 06/28/2019     Neuropathy Lab Results  Component Value Date   VITAMINB12 5,638 (H) 06/28/2019     CNS No results found.   Inflammation (CRP: Acute  ESR: Chronic) Lab Results  Component Value Date   CRP 0.6 06/28/2019   ESRSEDRATE 9 06/28/2019     Rheumatology No results found.   Coagulation No results found.   Cardiovascular Lab Results  Component Value Date   HGB 13.5 04/06/2018     Screening Lab Results  Component Value Date   SARSCOV2NAA NOT DETECTED 06/12/2018   COVIDSOURCE NASOPHARYNGEAL 06/12/2018     Cancer No results found.   Allergens No results found.     Note: Lab results reviewed.  Recent Diagnostic Imaging Review  Lumbosacral Imaging: Lumbar DG Bending views: Results for orders placed during the hospital encounter of 06/28/19 DG Lumbar Spine Complete W/Bend  Narrative CLINICAL DATA:  Chronic low back pain with possible radicular component  EXAM: LUMBAR SPINE - COMPLETE WITH BENDING VIEWS  COMPARISON:  None  FINDINGS: 5 non-rib-bearing lumbar vertebra with partial  lumbarization of S1.  Diffuse osseous demineralization.  Disc space narrowing L5-S1 and in lower thoracic spine.  Mild grade 1 anterolisthesis L4-L5 likely due to facet degenerative changes.  Vertebral body heights maintained without fracture or additional subluxation.  Facet degenerative changes L4-L5 and L5-S1.  No change in alignment with flexion or extension.  No spondylolysis.  Prior ventral hernia repair.  SI joints preserved.  IMPRESSION: Degenerative changes in lower lumbar spine with grade 1 anterolisthesis L4-5.  Osseous demineralization.  No acute abnormalities or abnormal motion with flexion or extension.  Electronically Signed By: Lavonia Dana M.D. On: 06/29/2019 08:20        Complexity Note: Imaging results reviewed. Results shared with Marisa Lewis, using Layman's terms.                        Meds   Current Outpatient Medications:  .  albuterol (PROVENTIL HFA;VENTOLIN HFA) 108 (90 Base) MCG/ACT inhaler, Inhale into the lungs every 6 (six) hours as needed for wheezing or shortness of breath., Disp: , Rfl:  .  cetirizine (ZYRTEC) 10 MG tablet, Take 10 mg by mouth daily., Disp: , Rfl:  .  citalopram (CELEXA) 40 MG tablet, Take 40 mg by mouth daily., Disp: , Rfl:  .  cyclobenzaprine (FLEXERIL) 10 MG tablet, Take 10 mg by mouth 3 (three) times daily as needed for muscle spasms., Disp: , Rfl:  .  eletriptan (RELPAX) 40 MG tablet, Take 40 mg by mouth as needed for migraine or headache. May repeat in 2 hours if headache persists or recurs., Disp: , Rfl:  .  ferrous sulfate 325 (65 FE) MG tablet, Take 325 mg by mouth daily with breakfast., Disp: , Rfl:  .  flavoxATE (URISPAS) 100 MG tablet, Take 100 mg by mouth as needed., Disp: , Rfl:  .  fluticasone (FLONASE) 50 MCG/ACT nasal spray, Place 2 sprays into both nostrils daily., Disp: , Rfl:  .  gabapentin (NEURONTIN) 300 MG capsule, Take 300 mg by mouth 3 (three) times daily., Disp: , Rfl:  .  Iron-Vitamin C  (VITRON-C) 65-125 MG TABS, Take 1 tablet by mouth daily., Disp: , Rfl:  .  omeprazole (PRILOSEC) 40 MG capsule, Take 40 mg by mouth daily., Disp: ,  Rfl:  .  Promethazine HCl (PHENERGAN PO), Take 10 mg by mouth., Disp: , Rfl:  .  SAXENDA 18 MG/3ML SOPN, , Disp: , Rfl:  .  SUMAtriptan (IMITREX) 6 MG/0.5ML SOLN injection, Inject 6 mg into the skin every 2 (two) hours as needed for migraine or headache. May repeat in 2 hours if headache persists or recurs., Disp: , Rfl:  .  topiramate (TOPAMAX) 100 MG tablet, Take 100 mg by mouth 2 (two) times daily., Disp: , Rfl:  .  traMADol (ULTRAM) 50 MG tablet, Take 1 tablet (50 mg total) by mouth every 8 (eight) hours as needed., Disp: 10 tablet, Rfl: 0 .  triamterene-hydrochlorothiazide (MAXZIDE-25) 37.5-25 MG tablet, Take 1 tablet by mouth daily., Disp: , Rfl:  .  vitamin B-12 (CYANOCOBALAMIN) 1000 MCG tablet, Take 2,000 mcg by mouth daily., Disp: , Rfl:  .  Vitamin D, Ergocalciferol, (DRISDOL) 1.25 MG (50000 UT) CAPS capsule, Take 50,000 Units by mouth every 7 (seven) days. , Disp: , Rfl:  .  zolpidem (AMBIEN) 5 MG tablet, Take 5 mg by mouth at bedtime as needed for sleep., Disp: , Rfl:   ROS  Constitutional: Denies any fever or chills Gastrointestinal: No reported hemesis, hematochezia, vomiting, or acute GI distress Musculoskeletal: Denies any acute onset joint swelling, redness, loss of ROM, or weakness Neurological: No reported episodes of acute onset apraxia, aphasia, dysarthria, agnosia, amnesia, paralysis, loss of coordination, or loss of consciousness  Allergies  Marisa Lewis is allergic to penicillins, asa [aspirin], and morphine and related.  [place]  Drug: Marisa Lewis  reports no history of drug use. Alcohol:  reports no history of alcohol use. Tobacco:  reports that she quit smoking about 4 years ago. She has never used smokeless tobacco. Medical:  has a past medical history of Asthma, GERD (gastroesophageal reflux disease), Migraine, and Sciatica  of left side. Surgical: Marisa Lewis  has a past surgical history that includes Gastric bypass (1997 and 2005); Abdominal hysterectomy; Umbilical hernia repair; Colonoscopy with propofol (N/A, 06/16/2018); polypectomy (N/A, 06/16/2018); and Splenectomy, total. Family: family history includes COPD in her mother; Diabetes in her father and mother; Hyperlipidemia in her mother; Kidney failure in her mother.  Constitutional Exam  General appearance: Well nourished, well developed, and well hydrated. In no apparent acute distress Vitals:   07/28/19 0834  BP: (!) 81/63  Pulse: 64  Resp: 16  Temp: (!) 97.4 F (36.3 C)  TempSrc: Temporal  SpO2: 100%  Weight: 238 lb (108 kg)  Height: '5\' 1"'  (1.549 m)   BMI Assessment: Estimated body mass index is 44.97 kg/m as calculated from the following:   Height as of this encounter: '5\' 1"'  (1.549 m).   Weight as of this encounter: 238 lb (108 kg).  BMI interpretation table: BMI level Category Range association with higher incidence of chronic pain  <18 kg/m2 Underweight   18.5-24.9 kg/m2 Ideal body weight   25-29.9 kg/m2 Overweight Increased incidence by 20%  30-34.9 kg/m2 Obese (Class I) Increased incidence by 68%  35-39.9 kg/m2 Severe obesity (Class II) Increased incidence by 136%  >40 kg/m2 Extreme obesity (Class III) Increased incidence by 254%   Patient's current BMI Ideal Body weight  Body mass index is 44.97 kg/m. Ideal body weight: 47.8 kg (105 lb 6.1 oz) Adjusted ideal body weight: 71.9 kg (158 lb 6.8 oz)   BMI Readings from Last 4 Encounters:  07/28/19 44.97 kg/m  06/28/19 44.97 kg/m  07/15/18 42.70 kg/m  06/16/18 43.84 kg/m   Wt Readings  from Last 4 Encounters:  07/28/19 238 lb (108 kg)  06/28/19 238 lb (108 kg)  07/15/18 226 lb (102.5 kg)  06/16/18 232 lb (105.2 kg)   Psych/Mental status: Alert, oriented x 3 (person, place, & time)       Eyes: PERLA Respiratory: No evidence of acute respiratory distress  Assessment & Plan   Primary Diagnosis & Pertinent Problem List: The primary encounter diagnosis was Chronic pain syndrome. 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  Visit Diagnosis: 1. Chronic pain syndrome   2. Chronic lower extremity pain (1ry area of Pain) (Left)   3. Chronic low back pain (2ry area of Pain) (Left) w/o sciatica   4. 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 Lumbosacral lateral recess stenosis (L5-S1) (Right)   6. 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)    Problems updated and reviewed during this visit: No problems updated.  Plan of Care  Pharmacotherapy (Medications Ordered): No orders of the defined types were placed in this encounter.   Procedure Orders     Lumbar Epidural Injection Lab Orders  No laboratory test(s) ordered today   Imaging Orders  No imaging studies ordered today   Referral Orders  No referral(s) requested today    Pharmacological management options:  Opioid Analgesics: We'll take over management today. See above orders Membrane stabilizer: Options discussed, including a trial. Muscle relaxant: We have discussed the possibility of a trial NSAID: Trial discussed. Other analgesic(s): To be determined at a later time    Interventional management options: Planned, scheduled, and/or pending:       Considering:   Diagnostic left L4-5 LESI #1  Diagnostic left lumbar facet block #1  Diagnostic left L4 TFESI + Left L4-5 LESI #1    PRN Procedures:   None at this time    Provider-requested follow-up: Return for Procedure (no sedation): (L) L4-5 LESI #1, (ASAP). Recent Visits Date Type Provider Dept  07/26/19 Appointment Milinda Pointer, MD  [facility]  06/28/19 Office Visit Milinda Pointer, MD [facility]  Showing recent visits within past 90 days and meeting all other requirements Today's Visits Date Type Provider Dept  07/28/19 Office Visit Milinda Pointer, MD [facility]  Showing today's visits and meeting all other requirements Future Appointments No visits were found meeting these conditions. Showing future appointments within next 90 days and meeting all other requirements  Primary Care Physician: Pearletha Alfred, PA Note by: Gaspar Cola, MD Date: 07/28/2019; Time: 9:11 AM

## 2019-07-27 NOTE — Patient Instructions (Addendum)
____________________________________________________________________________________________  Preparing for your procedure (without sedation)  Procedure appointments are limited to planned procedures: . No Prescription Refills. . No disability issues will be discussed. . No medication changes will be discussed.  Instructions: . Oral Intake: Do not eat or drink anything for at least 6 hours prior to your procedure. (Exception: Blood Pressure Medication. See below.) . Transportation: Unless otherwise stated by your physician, you may drive yourself after the procedure. . Blood Pressure Medicine: Do not forget to take your blood pressure medicine with a sip of water the morning of the procedure. If your Diastolic (lower reading)is above 100 mmHg, elective cases will be cancelled/rescheduled. . Blood thinners: These will need to be stopped for procedures. Notify our staff if you are taking any blood thinners. Depending on which one you take, there will be specific instructions on how and when to stop it. . Diabetics on insulin: Notify the staff so that you can be scheduled 1st case in the morning. If your diabetes requires high dose insulin, take only  of your normal insulin dose the morning of the procedure and notify the staff that you have done so. . Preventing infections: Shower with an antibacterial soap the morning of your procedure.  . Build-up your immune system: Take 1000 mg of Vitamin C with every meal (3 times a day) the day prior to your procedure. . Antibiotics: Inform the staff if you have a condition or reason that requires you to take antibiotics before dental procedures. . Pregnancy: If you are pregnant, call and cancel the procedure. . Sickness: If you have a cold, fever, or any active infections, call and cancel the procedure. . Arrival: You must be in the facility at least 30 minutes prior to your scheduled procedure. . Children: Do not bring any children with you. . Dress  appropriately: Bring dark clothing that you would not mind if they get stained. . Valuables: Do not bring any jewelry or valuables.  Reasons to call and reschedule or cancel your procedure: (Following these recommendations will minimize the risk of a serious complication.) . Surgeries: Avoid having procedures within 2 weeks of any surgery. (Avoid for 2 weeks before or after any surgery). . Flu Shots: Avoid having procedures within 2 weeks of a flu shots or . (Avoid for 2 weeks before or after immunizations). . Barium: Avoid having a procedure within 7-10 days after having had a radiological study involving the use of radiological contrast. (Myelograms, Barium swallow or enema study). . Heart attacks: Avoid any elective procedures or surgeries for the initial 6 months after a "Myocardial Infarction" (Heart Attack). . Blood thinners: It is imperative that you stop these medications before procedures. Let us know if you if you take any blood thinner.  . Infection: Avoid procedures during or within two weeks of an infection (including chest colds or gastrointestinal problems). Symptoms associated with infections include: Localized redness, fever, chills, night sweats or profuse sweating, burning sensation when voiding, cough, congestion, stuffiness, runny nose, sore throat, diarrhea, nausea, vomiting, cold or Flu symptoms, recent or current infections. It is specially important if the infection is over the area that we intend to treat. . Heart and lung problems: Symptoms that may suggest an active cardiopulmonary problem include: cough, chest pain, breathing difficulties or shortness of breath, dizziness, ankle swelling, uncontrolled high or unusually low blood pressure, and/or palpitations. If you are experiencing any of these symptoms, cancel your procedure and contact your primary care physician for an evaluation.  Remember:  Regular   Business hours are:  Monday to Thursday 8:00 AM to 4:00  PM  Provider's Schedule: Victory Strollo, MD:  Procedure days: Tuesday and Thursday 7:30 AM to 4:00 PM  Bilal Lateef, MD:  Procedure days: Monday and Wednesday 7:30 AM to 4:00 PM ____________________________________________________________________________________________    Epidural Steroid Injection Patient Information  Description: The epidural space surrounds the nerves as they exit the spinal cord.  In some patients, the nerves can be compressed and inflamed by a bulging disc or a tight spinal canal (spinal stenosis).  By injecting steroids into the epidural space, we can bring irritated nerves into direct contact with a potentially helpful medication.  These steroids act directly on the irritated nerves and can reduce swelling and inflammation which often leads to decreased pain.  Epidural steroids may be injected anywhere along the spine and from the neck to the low back depending upon the location of your pain.   After numbing the skin with local anesthetic (like Novocaine), a small needle is passed into the epidural space slowly.  You may experience a sensation of pressure while this is being done.  The entire block usually last less than 10 minutes.  Conditions which may be treated by epidural steroids:   Low back and leg pain  Neck and arm pain  Spinal stenosis  Post-laminectomy syndrome  Herpes zoster (shingles) pain  Pain from compression fractures  Preparation for the injection:  1. Do not eat any solid food or dairy products within 8 hours of your appointment.  2. You may drink clear liquids up to 3 hours before appointment.  Clear liquids include water, black coffee, juice or soda.  No milk or cream please. 3. You may take your regular medication, including pain medications, with a sip of water before your appointment  Diabetics should hold regular insulin (if taken separately) and take 1/2 normal NPH dos the morning of the procedure.  Carry some sugar containing  items with you to your appointment. 4. A driver must accompany you and be prepared to drive you home after your procedure.  5. Bring all your current medications with your. 6. An IV may be inserted and sedation may be given at the discretion of the physician.   7. A blood pressure cuff, EKG and other monitors will often be applied during the procedure.  Some patients may need to have extra oxygen administered for a short period. 8. You will be asked to provide medical information, including your allergies, prior to the procedure.  We must know immediately if you are taking blood thinners (like Coumadin/Warfarin)  Or if you are allergic to IV iodine contrast (dye). We must know if you could possible be pregnant.  Possible side-effects:  Bleeding from needle site  Infection (rare, may require surgery)  Nerve injury (rare)  Numbness & tingling (temporary)  Difficulty urinating (rare, temporary)  Spinal headache ( a headache worse with upright posture)  Light -headedness (temporary)  Pain at injection site (several days)  Decreased blood pressure (temporary)  Weakness in arm/leg (temporary)  Pressure sensation in back/neck (temporary)  Call if you experience:  Fever/chills associated with headache or increased back/neck pain.  Headache worsened by an upright position.  New onset weakness or numbness of an extremity below the injection site  Hives or difficulty breathing (go to the emergency room)  Inflammation or drainage at the infection site  Severe back/neck pain  Any new symptoms which are concerning to you  Please note:  Although the local anesthetic   injected can often make your back or neck feel good for several hours after the injection, the pain will likely return.  It takes 3-7 days for steroids to work in the epidural space.  You may not notice any pain relief for at least that one week.  If effective, we will often do a series of three injections spaced 3-6  weeks apart to maximally decrease your pain.  After the initial series, we generally will wait several months before considering a repeat injection of the same type.  If you have any questions, please call [phone] [facility] 

## 2019-07-28 ENCOUNTER — Encounter: Payer: Self-pay | Admitting: Pain Medicine

## 2019-07-28 ENCOUNTER — Other Ambulatory Visit: Payer: Self-pay

## 2019-07-28 ENCOUNTER — Ambulatory Visit: Payer: Managed Care, Other (non HMO) | Attending: Pain Medicine | Admitting: Pain Medicine

## 2019-07-28 VITALS — BP 81/63 | HR 64 | Temp 97.4°F | Resp 16 | Ht 61.0 in | Wt 238.0 lb

## 2019-07-28 DIAGNOSIS — M4807 Spinal stenosis, lumbosacral region: Secondary | ICD-10-CM | POA: Diagnosis present

## 2019-07-28 DIAGNOSIS — D1779 Benign lipomatous neoplasm of other sites: Secondary | ICD-10-CM | POA: Insufficient documentation

## 2019-07-28 DIAGNOSIS — G894 Chronic pain syndrome: Secondary | ICD-10-CM | POA: Diagnosis not present

## 2019-07-28 DIAGNOSIS — M79605 Pain in left leg: Secondary | ICD-10-CM | POA: Diagnosis present

## 2019-07-28 DIAGNOSIS — R7989 Other specified abnormal findings of blood chemistry: Secondary | ICD-10-CM

## 2019-07-28 DIAGNOSIS — G8929 Other chronic pain: Secondary | ICD-10-CM | POA: Diagnosis present

## 2019-07-28 DIAGNOSIS — M545 Low back pain, unspecified: Secondary | ICD-10-CM

## 2019-07-28 DIAGNOSIS — M48062 Spinal stenosis, lumbar region with neurogenic claudication: Secondary | ICD-10-CM | POA: Diagnosis present

## 2019-07-28 DIAGNOSIS — R748 Abnormal levels of other serum enzymes: Secondary | ICD-10-CM | POA: Diagnosis present

## 2019-07-28 DIAGNOSIS — E882 Lipomatosis, not elsewhere classified: Secondary | ICD-10-CM

## 2019-07-29 NOTE — Progress Notes (Signed)
Today I was able to get a hold of the official report on MRI of the lumbar spine done on 05/21/2019 at the [facility] (telephone number [phone]) The MRI reads as follows: Findings: Segmentation: 5 lumbar segments presumed for this description.  The last dynamic disc is assigned as L5-S1 but there is a vestigial disc between S1 and S2. Conus medullaris: No enlargement, masses or signal abnormality. Vertebral column: Alignment is normal.  No fracture or marrow abnormalities noted. L1-2: Based on the sagittal images there is no disc degeneration, spinal canal stenosis or foraminal stenosis. L2-3: Based on the sagittal images there is no disc degeneration, spinal canal stenosis or foraminal stenosis. L3-4: No evidence of disc degeneration or herniation.  There is no evidence of canal or foraminal stenosis. L4-5: Mild disc degeneration and mild to moderate right worse than left facet joint DJD as well as mild epidural lipomatosis causing mild spinal canal stenosis without lateral recess stenosis or neuroforaminal stenosis. L5-S1: Moderate disc degeneration, spur formation with broad-based right paracentral disc protrusion noted indenting the thecal sac causing mild somewhat asymmetric right-sided spinal canal stenosis, moderate right lateral recess stenosis.  The bulging disc causes minimal neuroforaminal narrowing below the exiting nerve root on each side.  Impression: 1.  Vestigial disc between S1 and S2. 2.  Disc degeneration at L4-5 and L5-S1 causing mild spinal canal stenosis at these 2 levels and moderate right L5-S1 lateral recess stenosis.  A copy of this result was scanned into the system.

## 2019-07-30 ENCOUNTER — Ambulatory Visit
Admission: EM | Admit: 2019-07-30 | Discharge: 2019-07-30 | Disposition: A | Discharge status: Home / Self Care | Payer: Managed Care, Other (non HMO) | Attending: Family Medicine | Admitting: Family Medicine

## 2019-07-30 ENCOUNTER — Encounter: Payer: Self-pay | Admitting: Emergency Medicine

## 2019-07-30 ENCOUNTER — Ambulatory Visit (INDEPENDENT_AMBULATORY_CARE_PROVIDER_SITE_OTHER): Payer: Managed Care, Other (non HMO)

## 2019-07-30 ENCOUNTER — Other Ambulatory Visit: Payer: Self-pay

## 2019-07-30 DIAGNOSIS — S9030XA Contusion of unspecified foot, initial encounter: Secondary | ICD-10-CM | POA: Diagnosis not present

## 2019-07-30 DIAGNOSIS — S93509A Unspecified sprain of unspecified toe(s), initial encounter: Secondary | ICD-10-CM

## 2019-07-30 DIAGNOSIS — S93601A Unspecified sprain of right foot, initial encounter: Secondary | ICD-10-CM

## 2019-07-30 MED ORDER — NAPROXEN 500 MG PO TABS
500.0000 mg | ORAL_TABLET | Freq: Two times a day (BID) | ORAL | 0 refills | Status: AC
Start: 2019-07-30 — End: ?
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
## 2019-07-30 NOTE — Discharge Instructions (Signed)
-  Naproxen: 1 tablet twice a day for 15 days for pain inflammation -Recommend over-the-counter Prilosec or Nexium, 1 tablet daily while taking the naproxen in combination with your other home medications. -Can also use Tylenol for pain as well -Rest ice and elevation, see attached -Support/compression wrap with Ace bandage -If no improvement in 7-10 days, follow-up with orthopedics.
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
## 2019-08-17 ENCOUNTER — Ambulatory Visit (HOSPITAL_BASED_OUTPATIENT_CLINIC_OR_DEPARTMENT_OTHER): Payer: Managed Care, Other (non HMO) | Admitting: Pain Medicine

## 2019-08-17 ENCOUNTER — Other Ambulatory Visit: Payer: Self-pay

## 2019-08-17 ENCOUNTER — Encounter: Payer: Self-pay | Admitting: Pain Medicine

## 2019-08-17 ENCOUNTER — Ambulatory Visit
Admission: RE | Admit: 2019-08-17 | Discharge: 2019-08-17 | Disposition: A | Discharge status: Home / Self Care | Payer: Managed Care, Other (non HMO) | Source: Ambulatory Visit | Attending: Pain Medicine | Admitting: Pain Medicine

## 2019-08-17 VITALS — BP 95/62 | HR 64 | Temp 97.2°F | Resp 18 | Ht 61.0 in | Wt 236.0 lb

## 2019-08-17 DIAGNOSIS — D1779 Benign lipomatous neoplasm of other sites: Secondary | ICD-10-CM | POA: Insufficient documentation

## 2019-08-17 DIAGNOSIS — M48062 Spinal stenosis, lumbar region with neurogenic claudication: Secondary | ICD-10-CM | POA: Diagnosis present

## 2019-08-17 DIAGNOSIS — M79605 Pain in left leg: Secondary | ICD-10-CM | POA: Insufficient documentation

## 2019-08-17 DIAGNOSIS — G8929 Other chronic pain: Secondary | ICD-10-CM | POA: Diagnosis present

## 2019-08-17 DIAGNOSIS — M545 Low back pain, unspecified: Secondary | ICD-10-CM

## 2019-08-17 DIAGNOSIS — R937 Abnormal findings on diagnostic imaging of other parts of musculoskeletal system: Secondary | ICD-10-CM

## 2019-08-17 DIAGNOSIS — M51369 Other intervertebral disc degeneration, lumbar region without mention of lumbar back pain or lower extremity pain: Secondary | ICD-10-CM

## 2019-08-17 DIAGNOSIS — M4807 Spinal stenosis, lumbosacral region: Secondary | ICD-10-CM

## 2019-08-17 DIAGNOSIS — M5136 Other intervertebral disc degeneration, lumbar region: Secondary | ICD-10-CM | POA: Insufficient documentation

## 2019-08-17 DIAGNOSIS — E882 Lipomatosis, not elsewhere classified: Secondary | ICD-10-CM

## 2019-08-17 MED ORDER — SODIUM CHLORIDE (PF) 0.9 % IJ SOLN
INTRAMUSCULAR | Status: AC
  Filled 2019-08-17: qty 10

## 2019-08-17 MED ORDER — IOHEXOL 180 MG/ML  SOLN
10.0000 mL | Freq: Once | INTRAMUSCULAR | Status: AC
  Administered 2019-08-17: 10 mL via EPIDURAL

## 2019-08-17 MED ORDER — LIDOCAINE HCL 2 % IJ SOLN
INTRAMUSCULAR | Status: AC
  Filled 2019-08-17: qty 20

## 2019-08-17 MED ORDER — TRIAMCINOLONE ACETONIDE 40 MG/ML IJ SUSP
40.0000 mg | Freq: Once | INTRAMUSCULAR | Status: AC
  Administered 2019-08-17: 40 mg

## 2019-08-17 MED ORDER — ROPIVACAINE HCL 2 MG/ML IJ SOLN
INTRAMUSCULAR | Status: AC
  Filled 2019-08-17: qty 10

## 2019-08-17 MED ORDER — LIDOCAINE HCL 2 % IJ SOLN
20.0000 mL | Freq: Once | INTRAMUSCULAR | Status: AC
  Administered 2019-08-17: 400 mg

## 2019-08-17 MED ORDER — TRIAMCINOLONE ACETONIDE 40 MG/ML IJ SUSP
INTRAMUSCULAR | Status: AC
  Filled 2019-08-17: qty 1

## 2019-08-17 MED ORDER — ROPIVACAINE HCL 2 MG/ML IJ SOLN
2.0000 mL | Freq: Once | INTRAMUSCULAR | Status: AC
  Administered 2019-08-17: 2 mL via EPIDURAL

## 2019-08-17 MED ORDER — SODIUM CHLORIDE 0.9% FLUSH
2.0000 mL | Freq: Once | INTRAVENOUS | Status: AC
  Administered 2019-08-17: 2 mL

## 2019-08-17 NOTE — Progress Notes (Signed)
PROVIDER NOTE: Information contained herein reflects review and annotations entered in association with encounter. Interpretation of such information and data should be left to medically-trained personnel. Information provided to patient can be located elsewhere in the medical record under "Patient Instructions". Document created using STT-dictation technology, any transcriptional errors that may result from process are unintentional.    Patient: Marisa Lewis  Service Category: Procedure  Provider: Gaspar Cola, MD  DOB: Aug 04, 1965  DOS: 08/17/2019  Location: [facility]  MRN: [id number]  Setting: Ambulatory - outpatient  Referring Provider: Milinda Pointer, MD  Type: Established Patient  Specialty: Interventional Pain Management  PCP: Pearletha Alfred, PA   Primary Reason for Visit: Interventional Pain Management Treatment. CC: Back Pain  Procedure:          Anesthesia, Analgesia, Anxiolysis:  Type: Diagnostic Inter-Laminar Epidural Steroid Injection  #1  Region: Lumbar Level: L4-5 Level. Laterality: Left Paramedial  Type: Local Anesthesia Indication(s): Analgesia         Route: Infiltration ([place]/IM) IV Access: Declined Sedation: Declined  Local Anesthetic: Lidocaine 1-2%  Position: Prone with head of the table was raised to facilitate breathing.   Indications: 1. DDD (degenerative disc disease), lumbar   2. 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 Lumbosacral lateral recess stenosis (L5-S1) (Right)   5. Chronic lower extremity pain (1ry area of Pain) (Left)   6. Chronic low back pain (2ry area of Pain) (Left) w/o sciatica   7. Abnormal MRI, lumbar spine (05/21/2019)    Pain Score: Pre-procedure: 3 /10 Post-procedure: 3 /10   Pre-op Assessment:  Marisa Lewis is a 54 y.o. (year old), female patient, seen today for interventional treatment. She  has a past surgical history that includes  Gastric bypass (1997 and 2005); Abdominal hysterectomy; Umbilical hernia repair; Colonoscopy with propofol (N/A, 06/16/2018); polypectomy (N/A, 06/16/2018); and Splenectomy, total. Marisa Lewis has a current medication list which includes the following prescription(s): albuterol, cetirizine, citalopram, cyclobenzaprine, doxycycline, eletriptan, ferrous sulfate, flavoxate, fluticasone, gabapentin, vitron-c, naproxen, omeprazole, promethazine hcl, saxenda, sumatriptan, topiramate, tramadol, triamterene-hydrochlorothiazide, vitamin b-12, vitamin d (ergocalciferol), and zolpidem. Her primarily concern today is the Back Pain  Initial Vital Signs:  Pulse/HCG Rate: 64ECG Heart Rate: 70 Temp: (!) 97.2 F (36.2 C) Resp: 16 BP: 105/63 SpO2: 99 %  BMI: Estimated body mass index is 44.59 kg/m as calculated from the following:   Height as of this encounter: 5\' 1"  (1.549 m).   Weight as of this encounter: 236 lb (107 kg).  Risk Assessment: Allergies: Reviewed. She is allergic to penicillins, aspirin, and morphine and related.  Allergy Precautions: None required Coagulopathies: Reviewed. None identified.  Blood-thinner therapy: None at this time Active Infection(s): Reviewed. None identified. Marisa Lewis is afebrile  Site Confirmation: Marisa Lewis was asked to confirm the procedure and laterality before marking the site Procedure checklist: Completed Consent: Before the procedure and under the influence of no sedative(s), amnesic(s), or anxiolytics, the patient was informed of the treatment options, risks and possible complications. To fulfill our ethical and legal obligations, as recommended by the American Medical Association's Code of Ethics, I have informed the patient of my clinical impression; the nature and purpose of the treatment or procedure; the risks, benefits, and possible complications of the intervention; the alternatives, including doing nothing; the risk(s) and benefit(s) of the alternative  treatment(s) or procedure(s); and the risk(s) and benefit(s) of doing nothing. The patient was provided information about the general  risks and possible complications associated with the procedure. These may include, but are not limited to: failure to achieve desired goals, infection, bleeding, organ or nerve damage, allergic reactions, paralysis, and death. In addition, the patient was informed of those risks and complications associated to Spine-related procedures, such as failure to decrease pain; infection (i.e.: Meningitis, epidural or intraspinal abscess); bleeding (i.e.: epidural hematoma, subarachnoid hemorrhage, or any other type of intraspinal or peri-dural bleeding); organ or nerve damage (i.e.: Any type of peripheral nerve, nerve root, or spinal cord injury) with subsequent damage to sensory, motor, and/or autonomic systems, resulting in permanent pain, numbness, and/or weakness of one or several areas of the body; allergic reactions; (i.e.: anaphylactic reaction); and/or death. Furthermore, the patient was informed of those risks and complications associated with the medications. These include, but are not limited to: allergic reactions (i.e.: anaphylactic or anaphylactoid reaction(s)); adrenal axis suppression; blood sugar elevation that in diabetics may result in ketoacidosis or comma; water retention that in patients with history of congestive heart failure may result in shortness of breath, pulmonary edema, and decompensation with resultant heart failure; weight gain; swelling or edema; medication-induced neural toxicity; particulate matter embolism and blood vessel occlusion with resultant organ, and/or nervous system infarction; and/or aseptic necrosis of one or more joints. Finally, the patient was informed that Medicine is not an exact science; therefore, there is also the possibility of unforeseen or unpredictable risks and/or possible complications that may result in a catastrophic  outcome. The patient indicated having understood very clearly. We have given the patient no guarantees and we have made no promises. Enough time was given to the patient to ask questions, all of which were answered to the patient's satisfaction. Marisa Lewis has indicated that she wanted to continue with the procedure. Attestation: I, the ordering provider, attest that I have discussed with the patient the benefits, risks, side-effects, alternatives, likelihood of achieving goals, and potential problems during recovery for the procedure that I have provided informed consent. Date  Time: 08/17/2019  9:35 AM  Pre-Procedure Preparation:  Monitoring: As per clinic protocol. Respiration, ETCO2, SpO2, BP, heart rate and rhythm monitor placed and checked for adequate function Safety Precautions: Patient was assessed for positional comfort and pressure points before starting the procedure. Time-out: I initiated and conducted the "Time-out" before starting the procedure, as per protocol. The patient was asked to participate by confirming the accuracy of the "Time Out" information. Verification of the correct person, site, and procedure were performed and confirmed by me, the nursing staff, and the patient. "Time-out" conducted as per Joint Commission's Universal Protocol (UP.01.01.01). Time: 1000  Description of Procedure:          Target Area: The interlaminar space, initially targeting the lower laminar border of the superior vertebral body. Approach: Paramedial approach. Area Prepped: Entire Posterior Lumbar Region DuraPrep (Iodine Povacrylex [0.7% available iodine] and Isopropyl Alcohol, 74% w/w) Safety Precautions: Aspiration looking for blood return was conducted prior to all injections. At no point did we inject any substances, as a needle was being advanced. No attempts were made at seeking any paresthesias. Safe injection practices and needle disposal techniques used. Medications properly checked for  expiration dates. SDV (single dose vial) medications used. Description of the Procedure: Protocol guidelines were followed. The procedure needle was introduced through the skin, ipsilateral to the reported pain, and advanced to the target area. Bone was contacted and the needle walked caudad, until the lamina was cleared. The epidural space was identified using "loss-of-resistance technique"  with 2-3 ml of PF-NaCl (0.9% NSS), in a 5cc LOR glass syringe.  Vitals:   08/17/19 0934 08/17/19 1000 08/17/19 1005 08/17/19 1008  BP: 105/63 (!) 106/49 94/63 95/62   Pulse: 64     Resp:  16 16 18   Temp: (!) 97.2 F (36.2 C)     SpO2: 99% 97% 99% 100%  Weight: 236 lb (107 kg)     Height: 5\' 1"  (1.549 m)       Start Time: 1000 hrs. End Time: 1006 hrs.  Materials:  Needle(s) Type: Epidural needle Gauge: 17G Length: 3.5-in Medication(s): Please see orders for medications and dosing details.  Imaging Guidance (Spinal):          Type of Imaging Technique: Fluoroscopy Guidance (Spinal) Indication(s): Assistance in needle guidance and placement for procedures requiring needle placement in or near specific anatomical locations not easily accessible without such assistance. Exposure Time: Please see nurses notes. Contrast: Before injecting any contrast, we confirmed that the patient did not have an allergy to iodine, shellfish, or radiological contrast. Once satisfactory needle placement was completed at the desired level, radiological contrast was injected. Contrast injected under live fluoroscopy. No contrast complications. See chart for type and volume of contrast used. Fluoroscopic Guidance: I was personally present during the use of fluoroscopy. "Tunnel Vision Technique" used to obtain the best possible view of the target area. Parallax error corrected before commencing the procedure. "Direction-depth-direction" technique used to introduce the needle under continuous pulsed fluoroscopy. Once target was  reached, antero-posterior, oblique, and lateral fluoroscopic projection used confirm needle placement in all planes. Images permanently stored in EMR. Interpretation: I personally interpreted the imaging intraoperatively. Adequate needle placement confirmed in multiple planes. Appropriate spread of contrast into desired area was observed. No evidence of afferent or efferent intravascular uptake. No intrathecal or subarachnoid spread observed. Permanent images saved into the patient's record.  Antibiotic Prophylaxis:   Anti-infectives (From admission, onward)   None     Indication(s): None identified  Post-operative Assessment:  Post-procedure Vital Signs:  Pulse/HCG Rate: 6469 Temp: (!) 97.2 F (36.2 C) Resp: 18 BP: 95/62 SpO2: 100 %  EBL: None  Complications: No immediate post-treatment complications observed by team, or reported by patient.  Note: The patient tolerated the entire procedure well. A repeat set of vitals were taken after the procedure and the patient was kept under observation following institutional policy, for this type of procedure. Post-procedural neurological assessment was performed, showing return to baseline, prior to discharge. The patient was provided with post-procedure discharge instructions, including a section on how to identify potential problems. Should any problems arise concerning this procedure, the patient was given instructions to immediately contact us, at any time, without hesitation. In any case, we plan to contact the patient by telephone for a follow-up status report regarding this interventional procedure.  Comments:  No additional relevant information.  Plan of Care  Orders:  Orders Placed This Encounter  Procedures  . Lumbar Epidural Injection    Scheduling Instructions:     Procedure: Interlaminar LESI L4-5     Laterality: Left-sided     Sedation: Patient's choice     Timeframe:  Today    Order Specific Question:   Where will this  procedure be performed?    Answer:   [facility]  . DG [facility] C-ARM 1-60 MIN NO REPORT    Intraoperative interpretation by procedural physician at [place].    Standing Status:   Standing    Number  of Occurrences:   1    Order Specific Question:   Reason for exam:    Answer:   Assistance in needle guidance and placement for procedures requiring needle placement in or near specific anatomical locations not easily accessible without such assistance.  . Informed Consent Details: Physician/Practitioner Attestation; Transcribe to consent form and obtain patient signature    Provider Attestation: I, Buckner Dossie Arbour, MD, (Pain Management Specialist), the physician/practitioner, attest that I have discussed with the patient the benefits, risks, side effects, alternatives, likelihood of achieving goals and potential problems during recovery for the procedure that I have provided informed consent.    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     Transcribe to consent form and obtain patient signature.  . Provide equipment / supplies at bedside    Equipment required: Single use, disposable, "Epidural Tray" Epidural Catheter: NOT required    Standing Status:   Standing    Number of Occurrences:   1    Order Specific Question:   Specify    Answer:   Epidural Tray   Chronic Opioid Analgesic:  None Highest recorded MME/day: 37.5 mg/day MME/day: 0 mg/day   Medications ordered for procedure: Meds ordered this encounter  Medications  . iohexol (OMNIPAQUE) 180 MG/ML injection 10 mL    Must be Myelogram-compatible. If not available, you may substitute with a water-soluble, non-ionic, hypoallergenic, myelogram-compatible radiological contrast medium.   Marland Kitchen lidocaine (XYLOCAINE) 2 % (with pres) injection 400 mg  . sodium chloride flush (NS) 0.9 % injection 2 mL  . ropivacaine (PF) 2 mg/mL (0.2%) (NAROPIN) injection 2 mL  . triamcinolone acetonide (KENALOG-40) injection 40 mg   Medications administered: We administered iohexol, lidocaine, sodium chloride flush, ropivacaine (PF) 2 mg/mL (0.2%), and triamcinolone acetonide.  See the medical record for exact dosing, route, and time of administration.  Follow-up plan:   Return for (20-min), (F2F), (PP) Follow-up, PM on Proc-day.       Interventional management options: Planned, scheduled, and/or pending:       Considering:   Diagnostic left L4-5 LESI #1  Diagnostic left lumbar facet block #1  Diagnostic left L4 TFESI + Left L4-5 LESI #1    PRN Procedures:   None at this time     Recent Visits Date Type Provider Dept  07/28/19 Office Visit Milinda Pointer, MD [facility]  06/28/19 Office Visit Milinda Pointer, MD [facility]  Showing recent visits within past 90 days and meeting all other requirements Today's Visits Date Type Provider Dept  08/17/19 Procedure visit Milinda Pointer, MD [facility]  Showing today's visits and meeting all other requirements Future Appointments Date Type Provider Dept  08/31/19 Appointment Milinda Pointer, MD [facility]  Showing future appointments within next 90 days and meeting all other requirements  Disposition: Discharge home  Discharge (Date  Time): 08/17/2019; 1020 hrs.   Primary Care Physician: Pearletha Alfred, PA Location: [facility] Note by: Gaspar Cola, MD Date: 08/17/2019; Time: 11:59 AM  Disclaimer:  Medicine is not an Chief Strategy Officer. The only guarantee in medicine is that nothing is guaranteed. It is important to note that the decision to proceed with this intervention was based on the information collected from the patient. The Data and  conclusions were drawn from the patient's questionnaire, the interview, and the physical examination. Because the information was provided in large part by the patient, it cannot be guaranteed that it has not been purposely or unconsciously manipulated. Every effort has been made to obtain as much relevant data as possible for this evaluation. It is important to note that the conclusions that lead to this procedure are derived in large part from the available data. Always take into account that the treatment will also be dependent on availability of resources and existing treatment guidelines, considered by other Pain Management Practitioners as being common knowledge and practice, at the time of the intervention. For Medico-Legal purposes, it is also important to point out that variation in procedural techniques and pharmacological choices are the acceptable norm. The indications, contraindications, technique, and results of the above procedure should only be interpreted and judged by a Board-Certified Interventional Pain Specialist with extensive familiarity and expertise in the same exact procedure and technique.

## 2019-08-17 NOTE — Patient Instructions (Addendum)
____________________________________________________________________________________________  Post-Procedure Discharge Instructions  Instructions:  Apply ice:   Purpose: This will minimize any swelling and discomfort after procedure.   When: Day of procedure, as soon as you get home.  How: Fill a plastic sandwich bag with crushed ice. Cover it with a small towel and apply to injection site.  How long: (15 min on, 15 min off) Apply for 15 minutes then remove x 15 minutes.  Repeat sequence on day of procedure, until you go to bed.  Apply heat:   Purpose: To treat any soreness and discomfort from the procedure.  When: Starting the next day after the procedure.  How: Apply heat to procedure site starting the day following the procedure.  How long: May continue to repeat daily, until discomfort goes away.  Food intake: Start with clear liquids (like water) and advance to regular food, as tolerated.   Physical activities: Keep activities to a minimum for the first 8 hours after the procedure. After that, then as tolerated.  Driving: If you have received any sedation, be responsible and do not drive. You are not allowed to drive for 24 hours after having sedation.  Blood thinner: (Applies only to those taking blood thinners) You may restart your blood thinner 6 hours after your procedure.  Insulin: (Applies only to Diabetic patients taking insulin) As soon as you can eat, you may resume your normal dosing schedule.  Infection prevention: Keep procedure site clean and dry. Shower daily and clean area with soap and water.  Post-procedure Pain Diary: Extremely important that this be done correctly and accurately. Recorded information will be used to determine the next step in treatment. For the purpose of accuracy, follow these rules:  Evaluate only the area treated. Do not report or include pain from an untreated area. For the purpose of this evaluation, ignore all other areas of pain,  except for the treated area.  After your procedure, avoid taking a long nap and attempting to complete the pain diary after you wake up. Instead, set your alarm clock to go off every hour, on the hour, for the initial 8 hours after the procedure. Document the duration of the numbing medicine, and the relief you are getting from it.  Do not go to sleep and attempt to complete it later. It will not be accurate. If you received sedation, it is likely that you were given a medication that may cause amnesia. Because of this, completing the diary at a later time may cause the information to be inaccurate. This information is needed to plan your care.  Follow-up appointment: Keep your post-procedure follow-up evaluation appointment after the procedure (usually 2 weeks for most procedures, 6 weeks for radiofrequencies). DO NOT FORGET to bring you pain diary with you.   Expect: (What should I expect to see with my procedure?)  From numbing medicine (AKA: Local Anesthetics): Numbness or decrease in pain. You may also experience some weakness, which if present, could last for the duration of the local anesthetic.  Onset: Full effect within 15 minutes of injected.  Duration: It will depend on the type of local anesthetic used. On the average, 1 to 8 hours.   From steroids (Applies only if steroids were used): Decrease in swelling or inflammation. Once inflammation is improved, relief of the pain will follow.  Onset of benefits: Depends on the amount of swelling present. The more swelling, the longer it will take for the benefits to be seen. In some cases, up to 10 days.    Duration: Steroids will stay in the system x 2 weeks. Duration of benefits will depend on multiple posibilities including persistent irritating factors.  Side-effects: If present, they may typically last 2 weeks (the duration of the steroids).  Frequent: Cramps (if they occur, drink Gatorade and take over-the-counter Magnesium 450-500 mg  once to twice a day); water retention with temporary weight gain; increases in blood sugar; decreased immune system response; increased appetite.  Occasional: Facial flushing (red, warm cheeks); mood swings; menstrual changes.  Uncommon: Long-term decrease or suppression of natural hormones; bone thinning. (These are more common with higher doses or more frequent use. This is why we prefer that our patients avoid having any injection therapies in other practices.)   Very Rare: Severe mood changes; psychosis; aseptic necrosis.  From procedure: Some discomfort is to be expected once the numbing medicine wears off. This should be minimal if ice and heat are applied as instructed.  Call if: (When should I call?)  You experience numbness and weakness that gets worse with time, as opposed to wearing off.  New onset bowel or bladder incontinence. (Applies only to procedures done in the spine)  Emergency Numbers:  Durning business hours (Monday - Thursday, 8:00 AM - 4:00 PM) (Friday, 9:00 AM - 12:00 Noon): [phone]  After hours: [phone]  NOTE: If you are having a problem and are unable connect with, or to talk to a provider, then go to your nearest urgent care or emergency department. If the problem is serious and urgent, please call 911. ____________________________________________________________________________________________   Pain Management Discharge Instructions  General Discharge Instructions :  If you need to reach your doctor call: Monday-Friday 8:00 am - 4:00 pm at [phone] or toll free [phone].  After clinic hours [phone] to have operator reach doctor.  Bring all of your medication bottles to all your appointments in the pain clinic.  To cancel or reschedule your appointment with Pain Management please remember to call 24 hours in advance to avoid a fee.  Refer to the educational materials which you have been given on: General Risks, I had my  Procedure. Discharge Instructions, Post Sedation.  Post Procedure Instructions:  The drugs you were given will stay in your system until tomorrow, so for the next 24 hours you should not drive, make any legal decisions or drink any alcoholic beverages.  You may eat anything you prefer, but it is better to start with liquids then soups and crackers, and gradually work up to solid foods.  Please notify your doctor immediately if you have any unusual bleeding, trouble breathing or pain that is not related to your normal pain.  Depending on the type of procedure that was done, some parts of your body may feel week and/or numb.  This usually clears up by tonight or the next day.  Walk with the use of an assistive device or accompanied by an adult for the 24 hours.  You may use ice on the affected area for the first 24 hours.  Put ice in a Ziploc bag and cover with a towel and place against area 15 minutes on 15 minutes off.  You may switch to heat after 24 hours.Epidural Steroid Injection Patient Information  Description: The epidural space surrounds the nerves as they exit the spinal cord.  In some patients, the nerves can be compressed and inflamed by a bulging disc or a tight spinal canal (spinal stenosis).  By injecting steroids into the epidural space, we can bring irritated nerves   into direct contact with a potentially helpful medication.  These steroids act directly on the irritated nerves and can reduce swelling and inflammation which often leads to decreased pain.  Epidural steroids may be injected anywhere along the spine and from the neck to the low back depending upon the location of your pain.   After numbing the skin with local anesthetic (like Novocaine), a small needle is passed into the epidural space slowly.  You may experience a sensation of pressure while this is being done.  The entire block usually last less than 10 minutes.  Conditions which may be treated by epidural  steroids:   Low back and leg pain  Neck and arm pain  Spinal stenosis  Post-laminectomy syndrome  Herpes zoster (shingles) pain  Pain from compression fractures  Preparation for the injection:  1. Do not eat any solid food or dairy products within 8 hours of your appointment.  2. You may drink clear liquids up to 3 hours before appointment.  Clear liquids include water, black coffee, juice or soda.  No milk or cream please. 3. You may take your regular medication, including pain medications, with a sip of water before your appointment  Diabetics should hold regular insulin (if taken separately) and take 1/2 normal NPH dos the morning of the procedure.  Carry some sugar containing items with you to your appointment. 4. A driver must accompany you and be prepared to drive you home after your procedure.  5. Bring all your current medications with your. 6. An IV may be inserted and sedation may be given at the discretion of the physician.   7. A blood pressure cuff, EKG and other monitors will often be applied during the procedure.  Some patients may need to have extra oxygen administered for a short period. 8. You will be asked to provide medical information, including your allergies, prior to the procedure.  We must know immediately if you are taking blood thinners (like Coumadin/Warfarin)  Or if you are allergic to IV iodine contrast (dye). We must know if you could possible be pregnant.  Possible side-effects:  Bleeding from needle site  Infection (rare, may require surgery)  Nerve injury (rare)  Numbness & tingling (temporary)  Difficulty urinating (rare, temporary)  Spinal headache ( a headache worse with upright posture)  Light -headedness (temporary)  Pain at injection site (several days)  Decreased blood pressure (temporary)  Weakness in arm/leg (temporary)  Pressure sensation in back/neck (temporary)  Call if you experience:  Fever/chills associated with  headache or increased back/neck pain.  Headache worsened by an upright position.  New onset weakness or numbness of an extremity below the injection site  Hives or difficulty breathing (go to the emergency room)  Inflammation or drainage at the infection site  Severe back/neck pain  Any new symptoms which are concerning to you  Please note:  Although the local anesthetic injected can often make your back or neck feel good for several hours after the injection, the pain will likely return.  It takes 3-7 days for steroids to work in the epidural space.  You may not notice any pain relief for at least that one week.  If effective, we will often do a series of three injections spaced 3-6 weeks apart to maximally decrease your pain.  After the initial series, we generally will wait several months before considering a repeat injection of the same type.  If you have any questions, please call [phone] [facility] 

## 2019-08-17 NOTE — Progress Notes (Signed)
Safety precautions to be maintained throughout the outpatient stay will include: orient to surroundings, keep bed in low position, maintain call bell within reach at all times, provide assistance with transfer out of bed and ambulation.  

## 2019-08-18 ENCOUNTER — Telehealth: Payer: Self-pay

## 2019-08-18 ENCOUNTER — Other Ambulatory Visit: Payer: Self-pay

## 2019-08-18 NOTE — Telephone Encounter (Signed)
Post procedure phone call.  Patient states she is doing well.  

## 2019-08-31 ENCOUNTER — Ambulatory Visit: Payer: Managed Care, Other (non HMO) | Attending: Pain Medicine | Admitting: Pain Medicine

## 2019-08-31 ENCOUNTER — Other Ambulatory Visit: Payer: Self-pay

## 2019-08-31 ENCOUNTER — Encounter: Payer: Self-pay | Admitting: Pain Medicine

## 2019-08-31 VITALS — BP 112/81 | HR 70 | Temp 97.0°F | Resp 18 | Ht 61.0 in | Wt 235.0 lb

## 2019-08-31 DIAGNOSIS — M47816 Spondylosis without myelopathy or radiculopathy, lumbar region: Secondary | ICD-10-CM | POA: Diagnosis present

## 2019-08-31 DIAGNOSIS — M545 Low back pain, unspecified: Secondary | ICD-10-CM

## 2019-08-31 DIAGNOSIS — G8929 Other chronic pain: Secondary | ICD-10-CM | POA: Insufficient documentation

## 2019-08-31 DIAGNOSIS — G894 Chronic pain syndrome: Secondary | ICD-10-CM | POA: Insufficient documentation

## 2019-08-31 DIAGNOSIS — M5136 Other intervertebral disc degeneration, lumbar region: Secondary | ICD-10-CM | POA: Diagnosis present

## 2019-08-31 DIAGNOSIS — M47817 Spondylosis without myelopathy or radiculopathy, lumbosacral region: Secondary | ICD-10-CM | POA: Diagnosis present

## 2019-08-31 NOTE — Progress Notes (Signed)
Safety precautions to be maintained throughout the outpatient stay will include: orient to surroundings, keep bed in low position, maintain call bell within reach at all times, provide assistance with transfer out of bed and ambulation.  

## 2019-08-31 NOTE — Patient Instructions (Signed)
____________________________________________________________________________________________  Preparing for Procedure with Sedation  Procedure appointments are limited to planned procedures: . No Prescription Refills. . No disability issues will be discussed. . No medication changes will be discussed.  Instructions: . Oral Intake: Do not eat or drink anything for at least 8 hours prior to your procedure. (Exception: Blood Pressure Medication. See below.) . Transportation: Unless otherwise stated by your physician, you may drive yourself after the procedure. . Blood Pressure Medicine: Do not forget to take your blood pressure medicine with a sip of water the morning of the procedure. If your Diastolic (lower reading)is above 100 mmHg, elective cases will be cancelled/rescheduled. . Blood thinners: These will need to be stopped for procedures. Notify our staff if you are taking any blood thinners. Depending on which one you take, there will be specific instructions on how and when to stop it. . Diabetics on insulin: Notify the staff so that you can be scheduled 1st case in the morning. If your diabetes requires high dose insulin, take only  of your normal insulin dose the morning of the procedure and notify the staff that you have done so. . Preventing infections: Shower with an antibacterial soap the morning of your procedure. . Build-up your immune system: Take 1000 mg of Vitamin C with every meal (3 times a day) the day prior to your procedure. . Antibiotics: Inform the staff if you have a condition or reason that requires you to take antibiotics before dental procedures. . Pregnancy: If you are pregnant, call and cancel the procedure. . Sickness: If you have a cold, fever, or any active infections, call and cancel the procedure. . Arrival: You must be in the facility at least 30 minutes prior to your scheduled procedure. . Children: Do not bring children with you. . Dress appropriately:  Bring dark clothing that you would not mind if they get stained. . Valuables: Do not bring any jewelry or valuables.  Reasons to call and reschedule or cancel your procedure: (Following these recommendations will minimize the risk of a serious complication.) . Surgeries: Avoid having procedures within 2 weeks of any surgery. (Avoid for 2 weeks before or after any surgery). . Flu Shots: Avoid having procedures within 2 weeks of a flu shots or . (Avoid for 2 weeks before or after immunizations). . Barium: Avoid having a procedure within 7-10 days after having had a radiological study involving the use of radiological contrast. (Myelograms, Barium swallow or enema study). . Heart attacks: Avoid any elective procedures or surgeries for the initial 6 months after a "Myocardial Infarction" (Heart Attack). . Blood thinners: It is imperative that you stop these medications before procedures. Let us know if you if you take any blood thinner.  . Infection: Avoid procedures during or within two weeks of an infection (including chest colds or gastrointestinal problems). Symptoms associated with infections include: Localized redness, fever, chills, night sweats or profuse sweating, burning sensation when voiding, cough, congestion, stuffiness, runny nose, sore throat, diarrhea, nausea, vomiting, cold or Flu symptoms, recent or current infections. It is specially important if the infection is over the area that we intend to treat. . Heart and lung problems: Symptoms that may suggest an active cardiopulmonary problem include: cough, chest pain, breathing difficulties or shortness of breath, dizziness, ankle swelling, uncontrolled high or unusually low blood pressure, and/or palpitations. If you are experiencing any of these symptoms, cancel your procedure and contact your primary care physician for an evaluation.  Remember:  Regular Business hours are:    Monday to Thursday 8:00 AM to 4:00 PM  Provider's  Schedule: Terrea Bruster, MD:  Procedure days: Tuesday and Thursday 7:30 AM to 4:00 PM  Bilal Lateef, MD:  Procedure days: Monday and Wednesday 7:30 AM to 4:00 PM ____________________________________________________________________________________________    

## 2019-08-31 NOTE — Progress Notes (Signed)
PROVIDER NOTE: Information contained herein reflects review and annotations entered in association with encounter. Interpretation of such information and data should be left to medically-trained personnel. Information provided to patient can be located elsewhere in the medical record under "Patient Instructions". Document created using STT-dictation technology, any transcriptional errors that may result from process are unintentional.    Patient: Marisa Lewis  Service Category: E/M  Provider: Gaspar Cola, MD  DOB: 10-22-1965  DOS: 08/31/2019  Specialty: Interventional Pain Management  MRN: [id number]  Setting: Ambulatory outpatient  PCP: Pearletha Alfred, PA  Type: Established Patient    Referring Provider: Pearletha Alfred, PA  Location: Office  Delivery: Face-to-face     HPI  Reason for encounter: [name], a 54 y.o. year old female, is here today for evaluation and management of her Chronic pain syndrome [G89.4]. Marisa Lewis primary complain today is Back Pain (lower) Last encounter: Practice (08/18/2019). My last encounter with her was on 08/17/2019. 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 Pain Assessment: Severity of Chronic pain is reported as a 5 /10. Location: Back Lower/occasionally has pain in left upper leg. Onset: More than a month ago. Quality: Sharp, Stabbing. Timing: Intermittent. Modifying factor(s):  stillness, heat, Tramadol. Vitals:  height is '5\' 1"'  (1.549 m) and weight is 235 lb (106.6 kg). Her temporal temperature is 97 F (36.1 C) (abnormal). Her blood pressure is 112/81 and her pulse is 70. Her respiration is 18 and oxygen saturation is 98%.   According to the patient she did great after the left L4-5 LESI #1.  She indicates that her lower extremity pain is now completely gone and the only thing that she has is pain in the lower back.  Provocative physical exam today was positive for hyperextension on rotation maneuver with exact reproduction of the patient's low back pain.  The patient experienced ipsilateral reproduction of the pain with the hyperextension or rotation maneuver with the right side being worse than the left.  She also indicates experiencing occasional popping and crunching in the area of the lower back with intermittent sharp paralyzing pains in the lower back with certain movements.  Today I took the time to explain to the patient that she has a bilateral lumbar facet syndrome likely to be associated with her grade 1 anterolisthesis of L4 over L5.  I went ahead and described typical symptoms experienced with this facet syndrome and she confirmed the dates exactly where she has been experiencing.  After a long conversation we have decided to schedule her to return for a diagnostic bilateral lumbar facet block under fluoroscopic guidance and IV sedation.  She has stated that her right side is the worse and if by any chance her left lower back pain goes away, will just to the right side.  However, it is not likely that this will be the case.  Post-Procedure Evaluation  Procedure (08/17/2019): Diagnostic left L4-5 LESI #1 under fluoroscopic guidance, no sedation. Pre-procedure pain level: 3/10 Post-procedure: 3/10 Limited initial benefit, possibly due to rapid discharge after no  sedation procedure, without enough time to allow full onset of block.  Sedation: None.  Effectiveness  during initial hour after procedure(Ultra-Short Term Relief): 100 %.  Local anesthetic used: Long-acting (4-6 hours) Effectiveness: Defined as any analgesic benefit obtained secondary to the administration of local anesthetics. This carries significant diagnostic value as to the etiological location, or anatomical origin, of the pain. Duration of benefit is expected to coincide with the duration of the local anesthetic used.  Effectiveness during initial 4-6 hours after procedure(Short-Term Relief): 75 %.  Long-term benefit: Defined as any relief past the pharmacologic duration of the local anesthetics.  Effectiveness past the initial 6 hours after procedure(Long-Term Relief): 50 %.  Current benefits: Defined as benefit that persist at this time.   Analgesia:  50% improved.  She indicates about 50% improvement of her left lower back pain and 100% improvement of her leg pain which currently is completely gone. Function: [name] reports improvement in function ROM: [name] reports improvement in ROM  Pharmacotherapy Assessment   Analgesic: None Highest recorded MME/day: 37.5 mg/day MME/day: 0 mg/day   Monitoring: [place] PMP: PDMP reviewed during this encounter.       Pharmacotherapy: No side-effects or adverse reactions reported. Compliance: No problems identified. Effectiveness: Clinically acceptable.  Landis Martins, RN  08/31/2019  2:22 PM  Sign when Signing Visit Safety precautions to be maintained throughout the outpatient stay will include: orient to surroundings, keep bed in low position, maintain call bell within reach at all times, provide assistance with transfer out of bed and ambulation.     UDS:  Summary  Date Value Ref Range Status  06/26/2019 Note  Final    Comment:    ==================================================================== [employer] [facility] (MW) ==================================================================== Test                              Result       Flag       Units  Drug Present and Declared for Prescription Verification   Tramadol                       558          EXPECTED   ng/mg creat   O-Desmethyltramadol            3849         EXPECTED   ng/mg creat   N-Desmethyltramadol            472          EXPECTED   ng/mg creat    Source of tramadol is a prescription medication. O-desmethyltramadol    and N-desmethyltramadol are expected metabolites of tramadol.  ==================================================================== Test                      Result    Flag   Units      Ref Range   Creatinine              57               mg/dL      >=20 ==================================================================== Declared Medications:  The flagging and interpretation on this report are based on the  following declared medications.  Unexpected results may arise from  inaccuracies in the declared medications.   **Note: The testing scope of this panel includes these medications:   Tramadol (Ultram)   **Note: The testing scope of  this panel does not include the  following reported medications:   Albuterol (Ventolin HFA)  Cetirizine (Zyrtec)  Cyanocobalamin  Cyclobenzaprine (Flexeril)  Eletriptan (Relpax)  Fluticasone (Flonase)  Gabapentin (Neurontin)  Iron  Liraglutide (Saxenda)  Omeprazole (Prilosec)  Promethazine  Sumatriptan (Imitrex)  Topiramate (Topamax)  Vitamin D2 (Drisdol)  Zolpidem (Ambien) ==================================================================== For clinical consultation, please call [phone]. ====================================================================      ROS  Constitutional: Denies any fever or chills Gastrointestinal: No reported hemesis, hematochezia, vomiting, or acute GI distress Musculoskeletal: Denies any acute onset joint swelling, redness, loss of ROM, or weakness Neurological: No reported episodes of acute onset apraxia, aphasia, dysarthria, agnosia,  amnesia, paralysis, loss of coordination, or loss of consciousness  Medication Review  Iron-Vitamin C, Liraglutide -Weight Management, Promethazine HCl, SUMAtriptan, Vitamin D (Ergocalciferol), albuterol, cetirizine, citalopram, cyclobenzaprine, doxycycline, eletriptan, ferrous sulfate, flavoxATE, fluticasone, gabapentin, naproxen, omeprazole, topiramate, traMADol, triamterene-hydrochlorothiazide, vitamin B-12, and zolpidem  History Review  Allergy: Marisa Lewis is allergic to penicillins, aspirin, and morphine and related. Drug: Marisa Lewis  reports no history of drug use. Alcohol:  reports no history of alcohol use. Tobacco:  reports that she quit smoking about 4 years ago. She has never used smokeless tobacco. Social: Marisa Lewis  reports that she quit smoking about 4 years ago. She has never used smokeless tobacco. She reports that she does not drink alcohol and does not use drugs. Medical:  has a past medical history of Asthma, GERD (gastroesophageal reflux disease), Migraine, and Sciatica of left side. Surgical: Marisa Lewis  has a past surgical history that includes Gastric bypass (1997 and 2005); Abdominal hysterectomy; Umbilical hernia repair; Colonoscopy with propofol (N/A, 06/16/2018); polypectomy (N/A, 06/16/2018); and Splenectomy, total. Family: family history includes COPD in her mother; Diabetes in her father and mother; Hyperlipidemia in her mother; Kidney failure in her mother.  Laboratory Chemistry Profile   Renal Lab Results  Component Value Date   BUN 16 06/28/2019   CREATININE 0.78 06/28/2019   GFRAA >60 06/28/2019   GFRNONAA >60 06/28/2019     Hepatic Lab Results  Component Value Date   AST 22 06/28/2019   ALT 15 06/28/2019   ALBUMIN 4.0 06/28/2019   ALKPHOS 55 06/28/2019     Electrolytes Lab Results  Component Value Date   NA 139 06/28/2019   K 3.6 06/28/2019   CL 103 06/28/2019   CALCIUM 9.1 06/28/2019   MG 2.4 06/28/2019     Bone Lab Results  Component Value  Date   VD25OH 73.07 06/28/2019     Inflammation (CRP: Acute Phase) (ESR: Chronic Phase) Lab Results  Component Value Date   CRP 0.6 06/28/2019   ESRSEDRATE 9 06/28/2019       Note: Above Lab results reviewed.  Recent Imaging Review  DG PAIN CLINIC C-ARM 1-60 MIN NO REPORT Fluoro was used, but no Radiologist interpretation will be provided.  Please refer to "NOTES" tab for provider progress note. Note: Reviewed        Physical Exam  General appearance: Well nourished, well developed, and well hydrated. In no apparent acute distress Mental status: Alert, oriented x 3 (person, place, & time)       Respiratory: No evidence of acute respiratory distress Eyes: PERLA Vitals: BP 112/81   Pulse 70   Temp (!) 97 F (36.1 C) (Temporal)   Resp 18   Ht '5\' 1"'  (1.549 m)   Wt 235 lb (106.6 kg)   SpO2 98%   BMI 44.40 kg/m  BMI: Estimated body mass  index is 44.4 kg/m as calculated from the following:   Height as of this encounter: '5\' 1"'  (1.549 m).   Weight as of this encounter: 235 lb (106.6 kg). Ideal: Ideal body weight: 47.8 kg (105 lb 6.1 oz) Adjusted ideal body weight: 71.3 kg (157 lb 3.6 oz)  Assessment   Status Diagnosis  Controlled Controlled Controlled 1. Chronic pain syndrome   2. Chronic low back pain (2ry area of Pain) (Left) w/o sciatica   3. Lumbar facet joint syndrome (Bilateral) (L>R)   4. Lumbosacral facet arthropathy (L4-5) (R>L)   5. DDD (degenerative disc disease), lumbar      Updated Problems: No problems updated.  Plan of Care  Problem-specific:  No problem-specific Assessment & Plan notes found for this encounter.  Marisa Lewis has a current medication list which includes the following long-term medication(s): albuterol, cetirizine, citalopram, eletriptan, ferrous sulfate, fluticasone, gabapentin, omeprazole, promethazine hcl, sumatriptan, topiramate, triamterene-hydrochlorothiazide, and zolpidem.  Pharmacotherapy (Medications Ordered): No  orders of the defined types were placed in this encounter.  Orders:  Orders Placed This Encounter  Procedures  . LUMBAR FACET(MEDIAL BRANCH NERVE BLOCK) MBNB    Standing Status:   Future    Standing Expiration Date:   10/01/2019    Scheduling Instructions:     Procedure: Lumbar facet block (AKA.: Lumbosacral medial branch nerve block)     Side: Bilateral     Level: L3-4, L4-5, & L5-S1 Facets (L2, L3, L4, L5, & S1 Medial Branch Nerves)     Sedation: Patient's choice.     Timeframe: ASAA    Order Specific Question:   Where will this procedure be performed?    Answer:   [facility]   Follow-up plan:   Return for Procedure (w/ sedation): (B) L-FCT Blk #1.      Interventional management options: Planned, scheduled, and/or pending:       Considering:   Diagnostic left L4-5 LESI #1  Diagnostic left lumbar facet block #1  Diagnostic left L4 TFESI + Left L4-5 LESI #1    PRN Procedures:   None at this time      Recent Visits Date Type Provider Dept  08/17/19 Procedure visit Milinda Pointer, MD [facility]  07/28/19 Office Visit Milinda Pointer, MD [facility]  06/28/19 Office Visit Milinda Pointer, MD [facility]  Showing recent visits within past 90 days and meeting all other requirements Today's Visits Date Type Provider Dept  08/31/19 Office Visit Milinda Pointer, MD [facility]  Showing today's visits and meeting all other requirements Future Appointments No visits were found meeting these conditions. Showing future appointments within next 90 days and meeting all other requirements  I discussed the assessment and treatment plan with the patient. The patient was provided an opportunity to ask questions and all were answered. The patient agreed with the plan and demonstrated an understanding of the instructions.  Patient advised to call back or seek an in-person evaluation if the symptoms or condition  worsens.  Duration of encounter: 30 minutes.  Note by: Gaspar Cola, MD Date: 08/31/2019; Time: 5:17 PM

## 2019-09-01 ENCOUNTER — Other Ambulatory Visit: Payer: Self-pay

## 2019-09-21 ENCOUNTER — Ambulatory Visit
Admission: RE | Admit: 2019-09-21 | Discharge: 2019-09-21 | Disposition: A | Discharge status: Home / Self Care | Payer: Managed Care, Other (non HMO) | Source: Ambulatory Visit | Attending: Pain Medicine | Admitting: Pain Medicine

## 2019-09-21 ENCOUNTER — Encounter: Payer: Self-pay | Admitting: Pain Medicine

## 2019-09-21 ENCOUNTER — Ambulatory Visit (HOSPITAL_BASED_OUTPATIENT_CLINIC_OR_DEPARTMENT_OTHER): Payer: Managed Care, Other (non HMO) | Admitting: Pain Medicine

## 2019-09-21 ENCOUNTER — Other Ambulatory Visit: Payer: Self-pay

## 2019-09-21 VITALS — BP 90/51 | HR 71 | Temp 97.1°F | Resp 16 | Ht 61.0 in | Wt 235.0 lb

## 2019-09-21 DIAGNOSIS — M5136 Other intervertebral disc degeneration, lumbar region: Secondary | ICD-10-CM | POA: Insufficient documentation

## 2019-09-21 DIAGNOSIS — M47817 Spondylosis without myelopathy or radiculopathy, lumbosacral region: Secondary | ICD-10-CM | POA: Diagnosis present

## 2019-09-21 DIAGNOSIS — G8929 Other chronic pain: Secondary | ICD-10-CM

## 2019-09-21 DIAGNOSIS — M47816 Spondylosis without myelopathy or radiculopathy, lumbar region: Secondary | ICD-10-CM | POA: Insufficient documentation

## 2019-09-21 DIAGNOSIS — M545 Low back pain: Secondary | ICD-10-CM | POA: Insufficient documentation

## 2019-09-21 MED ORDER — LIDOCAINE HCL 2 % IJ SOLN
20.0000 mL | Freq: Once | INTRAMUSCULAR | Status: AC
  Administered 2019-09-21: 400 mg

## 2019-09-21 MED ORDER — TRIAMCINOLONE ACETONIDE 40 MG/ML IJ SUSP
80.0000 mg | Freq: Once | INTRAMUSCULAR | Status: AC
  Administered 2019-09-21: 80 mg
  Filled 2019-09-21: qty 2

## 2019-09-21 MED ORDER — FENTANYL CITRATE (PF) 100 MCG/2ML IJ SOLN
25.0000 ug | INTRAMUSCULAR | Status: DC | PRN
  Administered 2019-09-21: 50 ug via INTRAVENOUS
  Filled 2019-09-21: qty 2

## 2019-09-21 MED ORDER — MIDAZOLAM HCL 5 MG/5ML IJ SOLN
1.0000 mg | INTRAMUSCULAR | Status: DC | PRN
  Administered 2019-09-21: 2 mg via INTRAVENOUS
  Filled 2019-09-21: qty 5

## 2019-09-21 MED ORDER — ROPIVACAINE HCL 2 MG/ML IJ SOLN
18.0000 mL | Freq: Once | INTRAMUSCULAR | Status: AC
  Administered 2019-09-21: 18 mL via PERINEURAL
  Filled 2019-09-21: qty 20

## 2019-09-21 MED ORDER — LACTATED RINGERS IV SOLN
1000.0000 mL | Freq: Once | INTRAVENOUS | Status: AC
  Administered 2019-09-21: 1000 mL via INTRAVENOUS

## 2019-09-21 NOTE — Patient Instructions (Signed)

## 2019-09-21 NOTE — Progress Notes (Signed)
PROVIDER NOTE: Information contained herein reflects review and annotations entered in association with encounter. Interpretation of such information and data should be left to medically-trained personnel. Information provided to patient can be located elsewhere in the medical record under "Patient Instructions". Document created using STT-dictation technology, any transcriptional errors that may result from process are unintentional.    Patient: Marisa Lewis  Service Category: Procedure  Provider: Gaspar Cola, MD  DOB: 08-Dec-1965  DOS: 09/21/2019  Location: [facility]  MRN: [id number]  Setting: Ambulatory - outpatient  Referring Provider: Milinda Pointer, MD  Type: Established Patient  Specialty: Interventional Pain Management  PCP: Pearletha Alfred, PA   Primary Reason for Visit: Interventional Pain Management Treatment. CC: Back Pain  Procedure:          Anesthesia, Analgesia, Anxiolysis:  Type: Lumbar Facet, Medial Branch Block(s) #1  Primary Purpose: Diagnostic Region: Posterolateral Lumbosacral Spine Level: L2, L3, L4, L5, & S1 Medial Branch Level(s). Injecting these levels blocks the L3-4, L4-5, and L5-S1 lumbar facet joints. Laterality: Bilateral  Type: Moderate (Conscious) Sedation combined with Local Anesthesia Indication(s): Analgesia and Anxiety Route: Intravenous (IV) IV Access: Secured Sedation: Meaningful verbal contact was maintained at all times during the procedure  Local Anesthetic: Lidocaine 1-2%  Position: Prone   Indications: 1. 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 Spondylosis without myelopathy or radiculopathy, lumbosacral region   3. Lumbosacral facet arthropathy (L4-5) (R>L)   4. DDD (degenerative disc disease), lumbar   5. Chronic low back pain (2ry area of Pain) (Left) w/o sciatica    Pain Score: Pre-procedure: 4 /10 Post-procedure: 0-No pain/10   Pre-op Assessment:  Marisa Lewis is a 54 y.o. (year old), female  patient, seen today for interventional treatment. She  has a past surgical history that includes Gastric bypass (1997 and 2005); Abdominal hysterectomy; Umbilical hernia repair; Colonoscopy with propofol (N/A, 06/16/2018); polypectomy (N/A, 06/16/2018); and Splenectomy, total. Marisa Lewis has a current medication list which includes the following prescription(s): albuterol, cetirizine, citalopram, cyclobenzaprine, eletriptan, ferrous sulfate, flavoxate, fluticasone, gabapentin, vitron-c, naproxen, omeprazole, promethazine hcl, saxenda, sumatriptan, topiramate, tramadol, triamterene-hydrochlorothiazide, vitamin b-12, vitamin d (ergocalciferol), zolpidem, and doxycycline, and the following Facility-Administered Medications: fentanyl and midazolam. Her primarily concern today is the Back Pain  Initial Vital Signs:  Pulse/HCG Rate: 71ECG Heart Rate: 70 Temp: (!) 97.2 F (36.2 C) Resp: 19 BP: 106/76 SpO2: 99 %  BMI: Estimated body mass index is 44.4 kg/m as calculated from the following:   Height as of this encounter: 5\' 1"  (1.549 m).   Weight as of this encounter: 235 lb (106.6 kg).  Risk Assessment: Allergies: Reviewed. She is allergic to penicillins, aspirin, and morphine and related.  Allergy Precautions: None required Coagulopathies: Reviewed. None identified.  Blood-thinner therapy: None at this time Active Infection(s): Reviewed. None identified. Marisa Lewis is afebrile  Site Confirmation: Marisa Lewis was asked to confirm the procedure and laterality before marking the site Procedure checklist: Completed Consent: Before the procedure and under the influence of no sedative(s), amnesic(s), or anxiolytics, the patient was informed of the treatment options, risks and possible complications. To fulfill our ethical and legal obligations, as recommended by the American Medical Association's Code of Ethics, I have informed the patient of my clinical impression; the nature and purpose of the treatment or  procedure; the risks, benefits, and possible complications of the intervention; the alternatives, including doing nothing; the risk(s) and benefit(s) of the alternative treatment(s) or procedure(s); and the risk(s) and benefit(s) of doing  nothing. The patient was provided information about the general risks and possible complications associated with the procedure. These may include, but are not limited to: failure to achieve desired goals, infection, bleeding, organ or nerve damage, allergic reactions, paralysis, and death. In addition, the patient was informed of those risks and complications associated to Spine-related procedures, such as failure to decrease pain; infection (i.e.: Meningitis, epidural or intraspinal abscess); bleeding (i.e.: epidural hematoma, subarachnoid hemorrhage, or any other type of intraspinal or peri-dural bleeding); organ or nerve damage (i.e.: Any type of peripheral nerve, nerve root, or spinal cord injury) with subsequent damage to sensory, motor, and/or autonomic systems, resulting in permanent pain, numbness, and/or weakness of one or several areas of the body; allergic reactions; (i.e.: anaphylactic reaction); and/or death. Furthermore, the patient was informed of those risks and complications associated with the medications. These include, but are not limited to: allergic reactions (i.e.: anaphylactic or anaphylactoid reaction(s)); adrenal axis suppression; blood sugar elevation that in diabetics may result in ketoacidosis or comma; water retention that in patients with history of congestive heart failure may result in shortness of breath, pulmonary edema, and decompensation with resultant heart failure; weight gain; swelling or edema; medication-induced neural toxicity; particulate matter embolism and blood vessel occlusion with resultant organ, and/or nervous system infarction; and/or aseptic necrosis of one or more joints. Finally, the patient was informed that Medicine is  not an exact science; therefore, there is also the possibility of unforeseen or unpredictable risks and/or possible complications that may result in a catastrophic outcome. The patient indicated having understood very clearly. We have given the patient no guarantees and we have made no promises. Enough time was given to the patient to ask questions, all of which were answered to the patient's satisfaction. Ms. Corwin has indicated that she wanted to continue with the procedure. Attestation: I, the ordering provider, attest that I have discussed with the patient the benefits, risks, side-effects, alternatives, likelihood of achieving goals, and potential problems during recovery for the procedure that I have provided informed consent. Date  Time: 09/21/2019  9:00 AM  Pre-Procedure Preparation:  Monitoring: As per clinic protocol. Respiration, ETCO2, SpO2, BP, heart rate and rhythm monitor placed and checked for adequate function Safety Precautions: Patient was assessed for positional comfort and pressure points before starting the procedure. Time-out: I initiated and conducted the "Time-out" before starting the procedure, as per protocol. The patient was asked to participate by confirming the accuracy of the "Time Out" information. Verification of the correct person, site, and procedure were performed and confirmed by me, the nursing staff, and the patient. "Time-out" conducted as per Joint Commission's Universal Protocol (UP.01.01.01). Time: [phone]  Description of Procedure:          Laterality: Bilateral. The procedure was performed in identical fashion on both sides. Levels:  L2, L3, L4, L5, & S1 Medial Branch Level(s) Area Prepped: Posterior Lumbosacral Region DuraPrep (Iodine Povacrylex [0.7% available iodine] and Isopropyl Alcohol, 74% w/w) Safety Precautions: Aspiration looking for blood return was conducted prior to all injections. At no point did we inject any substances, as a needle was being  advanced. Before injecting, the patient was told to immediately notify me if she was experiencing any new onset of "ringing in the ears, or metallic taste in the mouth". No attempts were made at seeking any paresthesias. Safe injection practices and needle disposal techniques used. Medications properly checked for expiration dates. SDV (single dose vial) medications used. After the completion of the procedure, all disposable  equipment used was discarded in the proper designated medical waste containers. Local Anesthesia: Protocol guidelines were followed. The patient was positioned over the fluoroscopy table. The area was prepped in the usual manner. The time-out was completed. The target area was identified using fluoroscopy. A 12-in long, straight, sterile hemostat was used with fluoroscopic guidance to locate the targets for each level blocked. Once located, the skin was marked with an approved surgical skin marker. Once all sites were marked, the skin (epidermis, dermis, and hypodermis), as well as deeper tissues (fat, connective tissue and muscle) were infiltrated with a small amount of a short-acting local anesthetic, loaded on a 10cc syringe with a 25G, 1.5-in  Needle. An appropriate amount of time was allowed for local anesthetics to take effect before proceeding to the next step. Local Anesthetic: Lidocaine 2.0% The unused portion of the local anesthetic was discarded in the proper designated containers. Technical explanation of process:  L2 Medial Branch Nerve Block (MBB): The target area for the L2 medial branch is at the junction of the postero-lateral aspect of the superior articular process and the superior, posterior, and medial edge of the transverse process of L3. Under fluoroscopic guidance, a Quincke needle was inserted until contact was made with os over the superior postero-lateral aspect of the pedicular shadow (target area). After negative aspiration for blood, 0.5 mL of the nerve block  solution was injected without difficulty or complication. The needle was removed intact. L3 Medial Branch Nerve Block (MBB): The target area for the L3 medial branch is at the junction of the postero-lateral aspect of the superior articular process and the superior, posterior, and medial edge of the transverse process of L4. Under fluoroscopic guidance, a Quincke needle was inserted until contact was made with os over the superior postero-lateral aspect of the pedicular shadow (target area). After negative aspiration for blood, 0.5 mL of the nerve block solution was injected without difficulty or complication. The needle was removed intact. L4 Medial Branch Nerve Block (MBB): The target area for the L4 medial branch is at the junction of the postero-lateral aspect of the superior articular process and the superior, posterior, and medial edge of the transverse process of L5. Under fluoroscopic guidance, a Quincke needle was inserted until contact was made with os over the superior postero-lateral aspect of the pedicular shadow (target area). After negative aspiration for blood, 0.5 mL of the nerve block solution was injected without difficulty or complication. The needle was removed intact. L5 Medial Branch Nerve Block (MBB): The target area for the L5 medial branch is at the junction of the postero-lateral aspect of the superior articular process and the superior, posterior, and medial edge of the sacral ala. Under fluoroscopic guidance, a Quincke needle was inserted until contact was made with os over the superior postero-lateral aspect of the pedicular shadow (target area). After negative aspiration for blood, 0.5 mL of the nerve block solution was injected without difficulty or complication. The needle was removed intact. S1 Medial Branch Nerve Block (MBB): The target area for the S1 medial branch is at the posterior and inferior 6 o'clock position of the L5-S1 facet joint. Under fluoroscopic guidance, the  Quincke needle inserted for the L5 MBB was redirected until contact was made with os over the inferior and postero aspect of the sacrum, at the 6 o' clock position under the L5-S1 facet joint (Target area). After negative aspiration for blood, 0.5 mL of the nerve block solution was injected without difficulty or complication. The  needle was removed intact.  Nerve block solution: 0.2% PF-Ropivacaine + Triamcinolone (40 mg/mL) diluted to a final concentration of 4 mg of Triamcinolone/mL of Ropivacaine The unused portion of the solution was discarded in the proper designated containers. Procedural Needles: 22-gauge, 7-inch, Quincke needles used for all levels.  Once the entire procedure was completed, the treated area was cleaned, making sure to leave some of the prepping solution back to take advantage of its long term bactericidal properties.   Illustration of the posterior view of the lumbar spine and the posterior neural structures. Laminae of L2 through S1 are labeled. DPRL5, dorsal primary ramus of L5; DPRS1, dorsal primary ramus of S1; DPR3, dorsal primary ramus of L3; FJ, facet (zygapophyseal) joint L3-L4; I, inferior articular process of L4; LB1, lateral branch of dorsal primary ramus of L1; IAB, inferior articular branches from L3 medial branch (supplies L4-L5 facet joint); IBP, intermediate branch plexus; MB3, medial branch of dorsal primary ramus of L3; NR3, third lumbar nerve root; S, superior articular process of L5; SAB, superior articular branches from L4 (supplies L4-5 facet joint also); TP3, transverse process of L3.  Vitals:   09/21/19 0952 09/21/19 1002 09/21/19 1013 09/21/19 1024  BP: 99/73 (!) 90/52 (!) 92/54 (!) 90/51  Pulse:      Resp: 16 16 15 16   Temp:  (!) 97.3 F (36.3 C)  (!) 97.1 F (36.2 C)  SpO2: 96% 97% 98% 98%  Weight:      Height:         Start Time: 0943 hrs. End Time: 0952 hrs.  Imaging Guidance (Spinal):          Type of Imaging Technique: Fluoroscopy  Guidance (Spinal) Indication(s): Assistance in needle guidance and placement for procedures requiring needle placement in or near specific anatomical locations not easily accessible without such assistance. Exposure Time: Please see nurses notes. Contrast: None used. Fluoroscopic Guidance: I was personally present during the use of fluoroscopy. "Tunnel Vision Technique" used to obtain the best possible view of the target area. Parallax error corrected before commencing the procedure. "Direction-depth-direction" technique used to introduce the needle under continuous pulsed fluoroscopy. Once target was reached, antero-posterior, oblique, and lateral fluoroscopic projection used confirm needle placement in all planes. Images permanently stored in EMR. Interpretation: No contrast injected. I personally interpreted the imaging intraoperatively. Adequate needle placement confirmed in multiple planes. Permanent images saved into the patient's record.  Antibiotic Prophylaxis:   Anti-infectives (From admission, onward)   None     Indication(s): None identified  Post-operative Assessment:  Post-procedure Vital Signs:  Pulse/HCG Rate: 7168 Temp: (!) 97.1 F (36.2 C) Resp: 16 BP: (!) 90/51 SpO2: 98 %  EBL: None  Complications: No immediate post-treatment complications observed by team, or reported by patient.  Note: The patient tolerated the entire procedure well. A repeat set of vitals were taken after the procedure and the patient was kept under observation following institutional policy, for this type of procedure. Post-procedural neurological assessment was performed, showing return to baseline, prior to discharge. The patient was provided with post-procedure discharge instructions, including a section on how to identify potential problems. Should any problems arise concerning this procedure, the patient was given instructions to immediately contact us, at any time, without hesitation. In any  case, we plan to contact the patient by telephone for a follow-up status report regarding this interventional procedure.  Comments:  No additional relevant information.  Plan of Care  Orders:  Orders Placed This Encounter  Procedures  .  LUMBAR FACET(MEDIAL BRANCH NERVE BLOCK) MBNB    Scheduling Instructions:     Procedure: Lumbar facet block (AKA.: Lumbosacral medial branch nerve block)     Side: Bilateral     Level: L3-4, L4-5, & L5-S1 Facets (L2, L3, L4, L5, & S1 Medial Branch Nerves)     Sedation: Patient's choice.     Timeframe: Today    Order Specific Question:   Where will this procedure be performed?    Answer:   [facility]  . [facility] C-ARM 1-60 MIN NO REPORT    Intraoperative interpretation by procedural physician at [place].    Standing Status:   Standing    Number of Occurrences:   1    Order Specific Question:   Reason for exam:    Answer:   Assistance in needle guidance and placement for procedures requiring needle placement in or near specific anatomical locations not easily accessible without such assistance.  . Informed Consent Details: Physician/Practitioner Attestation; Transcribe to consent form and obtain patient signature    Nursing Order: Transcribe to consent form and obtain patient signature. Note: Always confirm laterality of pain with Ms. Flavell, before procedure. Procedure: Lumbar Facet Block  under fluoroscopic guidance Indication/Reason: Low Back Pain, with our without leg pain, due to Facet Joint Arthralgia (Joint Pain) known as Lumbar Facet Syndrome, secondary to Lumbar, and/or Lumbosacral Spondylosis (Arthritis of the Spine), without myelopathy or radiculopathy (Nerve Damage). Provider Attestation: I, Willows Dossie Arbour, MD, (Pain Management Specialist), the physician/practitioner, attest that I have discussed with the patient the benefits, risks, side effects, alternatives, likelihood of achieving goals and potential  problems during recovery for the procedure that I have provided informed consent.  . Provide equipment / supplies at bedside    "Block Tray" (Disposable  single use) Needle type: Spinal Amount/quantity: 4 Size: Medium (5-inch) Gauge: 22G    Standing Status:   Standing    Number of Occurrences:   1    Order Specific Question:   Specify    Answer:   Block Tray   Chronic Opioid Analgesic:  None Highest recorded MME/day: 37.5 mg/day MME/day: 0 mg/day   Medications ordered for procedure: Meds ordered this encounter  Medications  . lidocaine (XYLOCAINE) 2 % (with pres) injection 400 mg  . lactated ringers infusion 1,000 mL  . midazolam (VERSED) 5 MG/5ML injection 1-2 mg    Make sure Flumazenil is available in the pyxis when using this medication. If oversedation occurs, administer 0.2 mg IV over 15 sec. If after 45 sec no response, administer 0.2 mg again over 1 min; may repeat at 1 min intervals; not to exceed 4 doses (1 mg)  . fentaNYL (SUBLIMAZE) injection 25-50 mcg    Make sure Narcan is available in the pyxis when using this medication. In the event of respiratory depression (RR< 8/min): Titrate NARCAN (naloxone) in increments of 0.1 to 0.2 mg IV at 2-3 minute intervals, until desired degree of reversal.  . ropivacaine (PF) 2 mg/mL (0.2%) (NAROPIN) injection 18 mL  . triamcinolone acetonide (KENALOG-40) injection 80 mg   Medications administered: We administered lidocaine, lactated ringers, midazolam, fentaNYL, ropivacaine (PF) 2 mg/mL (0.2%), and triamcinolone acetonide.  See the medical record for exact dosing, route, and time of administration.  Follow-up plan:   Return in about 2 weeks (around 10/05/2019) for (VV), (PP) Follow-up.       Interventional management options: Planned, scheduled, and/or pending:       Considering:   Diagnostic  left L4-5 LESI #1  Diagnostic left lumbar facet block #1  Diagnostic left L4 TFESI + Left L4-5 LESI #1    PRN Procedures:   None at  this time    Recent Visits Date Type Provider Dept  08/31/19 Office Visit Milinda Pointer, MD [facility]  08/17/19 Procedure visit Milinda Pointer, MD [facility]  07/28/19 Office Visit Milinda Pointer, MD [facility]  06/28/19 Office Visit Milinda Pointer, MD [facility]  Showing recent visits within past 90 days and meeting all other requirements Today's Visits Date Type Provider Dept  09/21/19 Procedure visit Milinda Pointer, MD [facility]  Showing today's visits and meeting all other requirements Future Appointments Date Type Provider Dept  10/12/19 Appointment Milinda Pointer, MD [facility]  Showing future appointments within next 90 days and meeting all other requirements  Disposition: Discharge home  Discharge (Date  Time): 09/21/2019; 1029 hrs.   Primary Care Physician: Pearletha Alfred, PA Location: [facility] Note by: Gaspar Cola, MD Date: 09/21/2019; Time: 11:27 AM  Disclaimer:  Medicine is not an Chief Strategy Officer. The only guarantee in medicine is that nothing is guaranteed. It is important to note that the decision to proceed with this intervention was based on the information collected from the patient. The Data and conclusions were drawn from the patient's questionnaire, the interview, and the physical examination. Because the information was provided in large part by the patient, it cannot be guaranteed that it has not been purposely or unconsciously manipulated. Every effort has been made to obtain as much relevant data as possible for this evaluation. It is important to note that the conclusions that lead to this procedure are derived in large part from the available data. Always take into account that the treatment will also be dependent on availability of resources and existing treatment guidelines, considered by other Pain Management Practitioners as being  common knowledge and practice, at the time of the intervention. For Medico-Legal purposes, it is also important to point out that variation in procedural techniques and pharmacological choices are the acceptable norm. The indications, contraindications, technique, and results of the above procedure should only be interpreted and judged by a Board-Certified Interventional Pain Specialist with extensive familiarity and expertise in the same exact procedure and technique.

## 2019-09-22 ENCOUNTER — Telehealth: Payer: Self-pay

## 2019-09-22 NOTE — Telephone Encounter (Signed)
Post procedure phone call.  Patient states she is doing ok but is having some lower back pain.  Instructed to use heat today.  Patient states the pain felt good yesterday while the numbing medication was in place.  Instructed that it may take several days for the steroid to start working.  Instructed patient to callus for any further questions or concerns.

## 2019-10-11 ENCOUNTER — Encounter: Payer: Self-pay | Admitting: Pain Medicine

## 2019-10-11 NOTE — Progress Notes (Signed)
Patient: Marisa Lewis  Service Category: E/M  Provider: Gaspar Cola, MD  DOB: 1965/08/07  DOS: 10/12/2019  Location: Office  MRN: [id number]  Setting: Ambulatory outpatient  Referring Provider: Pearletha Alfred, PA  Type: Established Patient  Specialty: Interventional Pain Management  PCP: Pearletha Alfred, PA  Location: Remote location  Delivery: TeleHealth     Virtual Encounter - Pain Management PROVIDER NOTE: Information contained herein reflects review and annotations entered in association with encounter. Interpretation of such information and data should be left to medically-trained personnel. Information provided to patient can be located elsewhere in the medical record under "Patient Instructions". Document created using STT-dictation technology, any transcriptional errors that may result from process are unintentional.    Contact & Pharmacy Preferred: [phone] Home: [phone] (home) Mobile: [phone] (mobile) E-mail: [email or web address]  [facility], [place] [address] [place] [address] Phone: [phone] Fax: [phone]   Pre-screening  Marisa Lewis offered "in-person" vs "virtual" encounter. Marisa Lewis indicated preferring virtual for this encounter.   Reason COVID-19*  Social distancing based on CDC and AMA recommendations.   I contacted Marisa Lewis on 10/12/2019 via telephone.      I clearly identified myself as Gaspar Cola, MD. I verified that I was speaking with the correct person using two identifiers (Name: Marisa Lewis, and date of birth: 08-15-1965).  Consent I sought verbal advanced consent from Marisa Lewis for virtual visit interactions. I informed Marisa Lewis of possible security and privacy concerns, risks, and limitations associated with providing "not-in-person" medical evaluation and management services. I also informed Marisa Lewis of the availability of  "in-person" appointments. Finally, I informed Marisa Lewis that there would be a charge for the virtual visit and that Marisa Lewis could be  personally, fully or partially, financially responsible for it. Marisa Lewis expressed understanding and agreed to proceed.   Historic Elements   Marisa Lewis is a 54 y.o. year old, female patient evaluated Marisa Lewis after our last contact on 09/21/2019. Marisa Lewis  has a past medical history of Asthma, GERD (gastroesophageal reflux disease), Migraine, and Sciatica of left side. Marisa Lewis also  has a past surgical history that includes Gastric bypass (1997 and 2005); Abdominal hysterectomy; Umbilical hernia repair; Colonoscopy with propofol (N/A, 06/16/2018); polypectomy (N/A, 06/16/2018); and Splenectomy, total. Marisa Lewis has a current medication list which includes the following prescription(s): albuterol, cetirizine, citalopram, cyclobenzaprine, eletriptan, ferrous sulfate, flavoxate, fluticasone, gabapentin, vitron-c, naproxen, omeprazole, promethazine hcl, saxenda, sumatriptan, topiramate, tramadol, triamterene-hydrochlorothiazide, vitamin b-12, vitamin d (ergocalciferol), zolpidem, and doxycycline. Marisa Lewis  reports that Marisa Lewis quit smoking about 4 years ago. Marisa Lewis has never used smokeless tobacco. Marisa Lewis reports that Marisa Lewis does not drink alcohol and does not use drugs. Marisa Lewis is allergic to penicillins, aspirin, and morphine and related.   HPI  Marisa Lewis, Marisa Lewis is being contacted for a post-procedure assessment.  Marisa Lewis I have taken the time to explain to the patient my interpretation of the results of the first diagnostic bilateral lumbar facet block.  It is my impression that because Marisa Lewis did obtain 100% relief of the pain for Marisa Lewis period of 1 to 3 days, where likely dealing with problems associated with the facet joints but the mechanism of the pain is likely to be more of a mechanical 1 than inflammatory.  Marisa Lewis I went over the possible plan of action including the possibility of doing a second diagnostic  lumbar facet  block.  Marisa Lewis indicated that Marisa Lewis is scheduled to have COVID-19 booster this Thursday and therefore we have decided to schedule the second diagnostic injection 2 to 3 weeks after that.  Should Marisa Lewis again get similar results, we may consider radiofrequency ablation.  Post-Procedure Evaluation  Procedure (09/21/2019): Diagnostic bilateral lumbar facet block #1 under fluoroscopic guidance and IV sedation Pre-procedure pain level: 4/10 Post-procedure: 0/10 (100% relief)  Sedation: Sedation provided.  Effectiveness during initial hour after procedure(Ultra-Short Term Relief): 100 %.  Local anesthetic used: Long-acting (4-6 hours) Effectiveness: Defined as any analgesic benefit obtained secondary to the administration of local anesthetics. This carries significant diagnostic value as to the etiological location, or anatomical origin, of the pain. Duration of benefit is expected to coincide with the duration of the local anesthetic used.  Effectiveness during initial 4-6 hours after procedure(Short-Term Relief): 100 %.  Long-term benefit: Defined as any relief past the pharmacologic duration of the local anesthetics.  Effectiveness past the initial 6 hours after procedure(Long-Term Relief): 0 %.  (The patient indicated that Marisa Lewis had 100% relief of the pain for approximately 1 to 3 days after the procedure after which then it went back to baseline.)  Current benefits: Defined as benefit that persist at this time.   Analgesia:  Back to baseline Function: Back to baseline ROM: Back to baseline  Pharmacotherapy Assessment  Analgesic: None Highest recorded MME/day: 37.5 mg/day MME/day: 0 mg/day   Monitoring: [place] PMP: PDMP reviewed during this encounter.       Pharmacotherapy: No side-effects or adverse reactions reported. Compliance: No problems identified. Effectiveness: Clinically acceptable. Plan: Refer to "POC".  UDS:  Summary  Date Value Ref Range Status  06/26/2019 Note  Final     Comment:    ==================================================================== [employer] Select 13 (MW) ==================================================================== Test                             Result       Flag       Units  Drug Present and Declared for Prescription Verification   Tramadol                       558          EXPECTED   ng/mg creat   O-Desmethyltramadol            3849         EXPECTED   ng/mg creat   N-Desmethyltramadol            472          EXPECTED   ng/mg creat    Source of tramadol is a prescription medication. O-desmethyltramadol    and N-desmethyltramadol are expected metabolites of tramadol.  ==================================================================== Test                      Result    Flag   Units      Ref Range   Creatinine              57               mg/dL      >=20 ==================================================================== Declared Medications:  The flagging and interpretation on this report are based on the  following declared medications.  Unexpected results may arise from  inaccuracies in the declared medications.   **Note: The testing scope of this panel includes these medications:   Tramadol (Ultram)   **  Note: The testing scope of this panel does not include the  following reported medications:   Albuterol (Ventolin HFA)  Cetirizine (Zyrtec)  Cyanocobalamin  Cyclobenzaprine (Flexeril)  Eletriptan (Relpax)  Fluticasone (Flonase)  Gabapentin (Neurontin)  Iron  Liraglutide (Saxenda)  Omeprazole (Prilosec)  Promethazine  Sumatriptan (Imitrex)  Topiramate (Topamax)  Vitamin D2 (Drisdol)  Zolpidem (Ambien) ==================================================================== For clinical consultation, please call [phone]. ====================================================================     Laboratory Chemistry Profile   Renal Lab Results  Component Value Date   BUN 16 06/28/2019    CREATININE 0.78 06/28/2019   GFRAA >60 06/28/2019   GFRNONAA >60 06/28/2019     Hepatic Lab Results  Component Value Date   AST 22 06/28/2019   ALT 15 06/28/2019   ALBUMIN 4.0 06/28/2019   ALKPHOS 55 06/28/2019     Electrolytes Lab Results  Component Value Date   NA 139 06/28/2019   K 3.6 06/28/2019   CL 103 06/28/2019   CALCIUM 9.1 06/28/2019   MG 2.4 06/28/2019     Bone Lab Results  Component Value Date   VD25OH 73.07 06/28/2019     Inflammation (CRP: Acute Phase) (ESR: Chronic Phase) Lab Results  Component Value Date   CRP 0.6 06/28/2019   ESRSEDRATE 9 06/28/2019       Note: Above Lab results reviewed.  Imaging  DG PAIN CLINIC C-ARM 1-60 MIN NO REPORT Fluoro was used, but no Radiologist interpretation will be provided.  Please refer to "NOTES" tab for provider progress note.  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 Diagnoses of Chronic low back pain (2ry area of Pain) (Left) w/o sciatica and DDD (degenerative disc disease), lumbar were also pertinent to this visit.  Plan of Care  Problem-specific:  No problem-specific Assessment & Plan notes found for this encounter.  Marisa Lewis has a current medication list which includes the following long-term medication(s): albuterol, cetirizine, citalopram, eletriptan, ferrous sulfate, fluticasone, gabapentin, omeprazole, promethazine hcl, sumatriptan, topiramate, triamterene-hydrochlorothiazide, and zolpidem.  Pharmacotherapy (Medications Ordered): No orders of the defined types were placed in this encounter.  Orders:  Orders Placed This Encounter  Procedures  . LUMBAR FACET(MEDIAL BRANCH NERVE BLOCK) MBNB    Standing Status:   Future    Standing Expiration Date:   11/12/2019    Scheduling Instructions:     Procedure: Lumbar facet block (AKA.: Lumbosacral medial branch nerve block)     Side: Bilateral     Level: L3-4, L4-5, & L5-S1 Facets (L2, L3, L4,  L5, & S1 Medial Branch Nerves)     Sedation: Patient's choice.     Timeframe: 3 weeks from now    Order Specific Question:   Where will this procedure be performed?    Answer:   [facility]   Follow-up plan:   Return in about 3 weeks (around 11/02/2019) for Procedure (w/ sedation): (B) L-FCT BLK #2.      Interventional management options: Planned, scheduled, and/or pending:       Considering:   Diagnostic left L4-5 LESI #1  Diagnostic left L4 TFESI + Left L4-5 LESI #1    PRN Procedures:   Diagnostic bilateral lumbar facet block #2     Recent Visits Date Type Provider Dept  09/21/19 Procedure visit Milinda Pointer, MD [facility]  08/31/19 Office Visit Milinda Pointer, MD [facility]  08/17/19 Procedure visit Milinda Pointer, MD [facility]  07/28/19 Office Visit Milinda Pointer, MD [facility]  Showing recent visits within past 90 days and meeting all other requirements Marisa Lewis's Visits Date Type Provider Dept  10/12/19 Telemedicine Milinda Pointer, MD [facility]  Showing Marisa Lewis's visits and meeting all other requirements Future Appointments No visits were found meeting these conditions. Showing future appointments within next 90 days and meeting all other requirements  I discussed the assessment and treatment plan with the patient. The patient was provided an opportunity to ask questions and all were answered. The patient agreed with the plan and demonstrated an understanding of the instructions.  Patient advised to call back or seek an in-person evaluation if the symptoms or condition worsens.  Duration of encounter: 18 minutes.  Note by: Gaspar Cola, MD Date: 10/12/2019; Time: 3:47 PM

## 2019-10-12 ENCOUNTER — Other Ambulatory Visit: Payer: Self-pay

## 2019-10-12 ENCOUNTER — Ambulatory Visit: Payer: Managed Care, Other (non HMO) | Attending: Pain Medicine | Admitting: Pain Medicine

## 2019-10-12 DIAGNOSIS — G8929 Other chronic pain: Secondary | ICD-10-CM | POA: Diagnosis not present

## 2019-10-12 DIAGNOSIS — M47816 Spondylosis without myelopathy or radiculopathy, lumbar region: Secondary | ICD-10-CM | POA: Diagnosis not present

## 2019-10-12 DIAGNOSIS — M545 Low back pain, unspecified: Secondary | ICD-10-CM

## 2019-10-12 DIAGNOSIS — M5136 Other intervertebral disc degeneration, lumbar region: Secondary | ICD-10-CM

## 2019-10-12 NOTE — Patient Instructions (Signed)
____________________________________________________________________________________________  Preparing for Procedure with Sedation  Procedure appointments are limited to planned procedures: . No Prescription Refills. . No disability issues will be discussed. . No medication changes will be discussed.  Instructions: . Oral Intake: Do not eat or drink anything for at least 8 hours prior to your procedure. (Exception: Blood Pressure Medication. See below.) . Transportation: Unless otherwise stated by your physician, you may drive yourself after the procedure. . Blood Pressure Medicine: Do not forget to take your blood pressure medicine with a sip of water the morning of the procedure. If your Diastolic (lower reading)is above 100 mmHg, elective cases will be cancelled/rescheduled. . Blood thinners: These will need to be stopped for procedures. Notify our staff if you are taking any blood thinners. Depending on which one you take, there will be specific instructions on how and when to stop it. . Diabetics on insulin: Notify the staff so that you can be scheduled 1st case in the morning. If your diabetes requires high dose insulin, take only  of your normal insulin dose the morning of the procedure and notify the staff that you have done so. . Preventing infections: Shower with an antibacterial soap the morning of your procedure. . Build-up your immune system: Take 1000 mg of Vitamin C with every meal (3 times a day) the day prior to your procedure. . Antibiotics: Inform the staff if you have a condition or reason that requires you to take antibiotics before dental procedures. . Pregnancy: If you are pregnant, call and cancel the procedure. . Sickness: If you have a cold, fever, or any active infections, call and cancel the procedure. . Arrival: You must be in the facility at least 30 minutes prior to your scheduled procedure. . Children: Do not bring children with you. . Dress appropriately:  Bring dark clothing that you would not mind if they get stained. . Valuables: Do not bring any jewelry or valuables.  Reasons to call and reschedule or cancel your procedure: (Following these recommendations will minimize the risk of a serious complication.) . Surgeries: Avoid having procedures within 2 weeks of any surgery. (Avoid for 2 weeks before or after any surgery). . Flu Shots: Avoid having procedures within 2 weeks of a flu shots or . (Avoid for 2 weeks before or after immunizations). . Barium: Avoid having a procedure within 7-10 days after having had a radiological study involving the use of radiological contrast. (Myelograms, Barium swallow or enema study). . Heart attacks: Avoid any elective procedures or surgeries for the initial 6 months after a "Myocardial Infarction" (Heart Attack). . Blood thinners: It is imperative that you stop these medications before procedures. Let us know if you if you take any blood thinner.  . Infection: Avoid procedures during or within two weeks of an infection (including chest colds or gastrointestinal problems). Symptoms associated with infections include: Localized redness, fever, chills, night sweats or profuse sweating, burning sensation when voiding, cough, congestion, stuffiness, runny nose, sore throat, diarrhea, nausea, vomiting, cold or Flu symptoms, recent or current infections. It is specially important if the infection is over the area that we intend to treat. . Heart and lung problems: Symptoms that may suggest an active cardiopulmonary problem include: cough, chest pain, breathing difficulties or shortness of breath, dizziness, ankle swelling, uncontrolled high or unusually low blood pressure, and/or palpitations. If you are experiencing any of these symptoms, cancel your procedure and contact your primary care physician for an evaluation.  Remember:  Regular Business hours are:    Monday to Thursday 8:00 AM to 4:00 PM  Provider's  Schedule: Zniyah Midkiff, MD:  Procedure days: Tuesday and Thursday 7:30 AM to 4:00 PM  Bilal Lateef, MD:  Procedure days: Monday and Wednesday 7:30 AM to 4:00 PM ____________________________________________________________________________________________    

## 2019-11-02 ENCOUNTER — Encounter: Payer: Self-pay | Admitting: Pain Medicine

## 2019-11-02 ENCOUNTER — Other Ambulatory Visit: Payer: Self-pay

## 2019-11-02 ENCOUNTER — Ambulatory Visit (HOSPITAL_BASED_OUTPATIENT_CLINIC_OR_DEPARTMENT_OTHER): Payer: Managed Care, Other (non HMO) | Admitting: Pain Medicine

## 2019-11-02 ENCOUNTER — Ambulatory Visit
Admission: RE | Admit: 2019-11-02 | Discharge: 2019-11-02 | Disposition: A | Discharge status: Home / Self Care | Payer: Managed Care, Other (non HMO) | Source: Ambulatory Visit | Attending: Pain Medicine | Admitting: Pain Medicine

## 2019-11-02 VITALS — BP 112/73 | HR 69 | Temp 97.1°F | Resp 14 | Ht 61.0 in | Wt 235.0 lb

## 2019-11-02 DIAGNOSIS — M47816 Spondylosis without myelopathy or radiculopathy, lumbar region: Secondary | ICD-10-CM | POA: Insufficient documentation

## 2019-11-02 DIAGNOSIS — M47817 Spondylosis without myelopathy or radiculopathy, lumbosacral region: Secondary | ICD-10-CM

## 2019-11-02 DIAGNOSIS — M5136 Other intervertebral disc degeneration, lumbar region: Secondary | ICD-10-CM

## 2019-11-02 DIAGNOSIS — G8929 Other chronic pain: Secondary | ICD-10-CM | POA: Diagnosis present

## 2019-11-02 DIAGNOSIS — M545 Low back pain, unspecified: Secondary | ICD-10-CM

## 2019-11-02 MED ORDER — ROPIVACAINE HCL 2 MG/ML IJ SOLN
INTRAMUSCULAR | Status: AC
  Filled 2019-11-02: qty 20

## 2019-11-02 MED ORDER — FENTANYL CITRATE (PF) 100 MCG/2ML IJ SOLN
INTRAMUSCULAR | Status: AC
  Filled 2019-11-02: qty 2

## 2019-11-02 MED ORDER — ROPIVACAINE HCL 2 MG/ML IJ SOLN
18.0000 mL | Freq: Once | INTRAMUSCULAR | Status: AC
  Administered 2019-11-02: 18 mL via PERINEURAL

## 2019-11-02 MED ORDER — MIDAZOLAM HCL 5 MG/5ML IJ SOLN
INTRAMUSCULAR | Status: AC
  Filled 2019-11-02: qty 5

## 2019-11-02 MED ORDER — LACTATED RINGERS IV SOLN
1000.0000 mL | Freq: Once | INTRAVENOUS | Status: AC
  Administered 2019-11-02: 1000 mL via INTRAVENOUS

## 2019-11-02 MED ORDER — TRIAMCINOLONE ACETONIDE 40 MG/ML IJ SUSP
INTRAMUSCULAR | Status: AC
  Filled 2019-11-02: qty 2

## 2019-11-02 MED ORDER — MIDAZOLAM HCL 5 MG/5ML IJ SOLN
1.0000 mg | INTRAMUSCULAR | Status: DC | PRN
  Administered 2019-11-02: 1 mg via INTRAVENOUS

## 2019-11-02 MED ORDER — LIDOCAINE HCL 2 % IJ SOLN
20.0000 mL | Freq: Once | INTRAMUSCULAR | Status: AC
  Administered 2019-11-02: 400 mg

## 2019-11-02 MED ORDER — FENTANYL CITRATE (PF) 100 MCG/2ML IJ SOLN
25.0000 ug | INTRAMUSCULAR | Status: DC | PRN
  Administered 2019-11-02: 50 ug via INTRAVENOUS

## 2019-11-02 MED ORDER — TRIAMCINOLONE ACETONIDE 40 MG/ML IJ SUSP
80.0000 mg | Freq: Once | INTRAMUSCULAR | Status: AC
  Administered 2019-11-02: 80 mg

## 2019-11-02 MED ORDER — LIDOCAINE HCL 2 % IJ SOLN
INTRAMUSCULAR | Status: AC
  Filled 2019-11-02: qty 20

## 2019-11-02 NOTE — Progress Notes (Signed)
PROVIDER NOTE: Information contained herein reflects review and annotations entered in association with encounter. Interpretation of such information and data should be left to medically-trained personnel. Information provided to patient can be located elsewhere in the medical record under "Patient Instructions". Document created using STT-dictation technology, any transcriptional errors that may result from process are unintentional.    Patient: Marisa Lewis  Service Category: Procedure  Provider: Gaspar Cola, MD  DOB: Sep 13, 1965  DOS: 11/02/2019  Location: [facility]  MRN: [id number]  Setting: Ambulatory - outpatient  Referring Provider: Milinda Pointer, MD  Type: Established Patient  Specialty: Interventional Pain Management  PCP: Pearletha Alfred, PA   Primary Reason for Visit: Interventional Pain Management Treatment. CC: Back Pain (lower)  Procedure:          Anesthesia, Analgesia, Anxiolysis:  Type: Lumbar Facet, Medial Branch Block(s) #2  Primary Purpose: Diagnostic Region: Posterolateral Lumbosacral Spine Level: L2, L3, L4, L5, & S1 Medial Branch Level(s). Injecting these levels blocks the L3-4, L4-5, and L5-S1 lumbar facet joints. Laterality: Bilateral  Type: Moderate (Conscious) Sedation combined with Local Anesthesia Indication(s): Analgesia and Anxiety Route: Intravenous (IV) IV Access: Secured Sedation: Meaningful verbal contact was maintained at all times during the procedure  Local Anesthetic: Lidocaine 1-2%  Position: Prone   Indications: 1. 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 Spondylosis without myelopathy or radiculopathy, lumbosacral region   3. Lumbosacral facet arthropathy (L4-5) (R>L)   4. DDD (degenerative disc disease), lumbar   5. Chronic low back pain (2ry area of Pain) (Left) w/o sciatica    Pain Score: Pre-procedure: 3 /10 Post-procedure: 0-No pain/10   Pre-op Assessment:  Marisa Lewis is a 54 y.o. (year old),  female patient, seen today for interventional treatment. She  has a past surgical history that includes Gastric bypass (1997 and 2005); Abdominal hysterectomy; Umbilical hernia repair; Colonoscopy with propofol (N/A, 06/16/2018); polypectomy (N/A, 06/16/2018); and Splenectomy, total. Marisa Lewis has a current medication list which includes the following prescription(s): albuterol, cetirizine, citalopram, cyclobenzaprine, eletriptan, ferrous sulfate, flavoxate, fluticasone, gabapentin, vitron-c, naproxen, omeprazole, promethazine hcl, saxenda, sumatriptan, topiramate, tramadol, triamterene-hydrochlorothiazide, vitamin b-12, vitamin d (ergocalciferol), zolpidem, and doxycycline, and the following Facility-Administered Medications: fentanyl and midazolam. Her primarily concern today is the Back Pain (lower)  Initial Vital Signs:  Pulse/HCG Rate: 69ECG Heart Rate: 72 Temp: (!) 97.4 F (36.3 C) Resp: 16 BP: 117/83 SpO2: 100 %  BMI: Estimated body mass index is 44.4 kg/m as calculated from the following:   Height as of this encounter: 5\' 1"  (1.549 m).   Weight as of this encounter: 235 lb (106.6 kg).  Risk Assessment: Allergies: Reviewed. She is allergic to penicillins, aspirin, and morphine and related.  Allergy Precautions: None required Coagulopathies: Reviewed. None identified.  Blood-thinner therapy: None at this time Active Infection(s): Reviewed. None identified. Marisa Lewis is afebrile  Site Confirmation: Marisa Lewis was asked to confirm the procedure and laterality before marking the site Procedure checklist: Completed Consent: Before the procedure and under the influence of no sedative(s), amnesic(s), or anxiolytics, the patient was informed of the treatment options, risks and possible complications. To fulfill our ethical and legal obligations, as recommended by the American Medical Association's Code of Ethics, I have informed the patient of my clinical impression; the nature and purpose of the  treatment or procedure; the risks, benefits, and possible complications of the intervention; the alternatives, including doing nothing; the risk(s) and benefit(s) of the alternative treatment(s) or procedure(s); and the risk(s) and benefit(s)  of doing nothing. The patient was provided information about the general risks and possible complications associated with the procedure. These may include, but are not limited to: failure to achieve desired goals, infection, bleeding, organ or nerve damage, allergic reactions, paralysis, and death. In addition, the patient was informed of those risks and complications associated to Spine-related procedures, such as failure to decrease pain; infection (i.e.: Meningitis, epidural or intraspinal abscess); bleeding (i.e.: epidural hematoma, subarachnoid hemorrhage, or any other type of intraspinal or peri-dural bleeding); organ or nerve damage (i.e.: Any type of peripheral nerve, nerve root, or spinal cord injury) with subsequent damage to sensory, motor, and/or autonomic systems, resulting in permanent pain, numbness, and/or weakness of one or several areas of the body; allergic reactions; (i.e.: anaphylactic reaction); and/or death. Furthermore, the patient was informed of those risks and complications associated with the medications. These include, but are not limited to: allergic reactions (i.e.: anaphylactic or anaphylactoid reaction(s)); adrenal axis suppression; blood sugar elevation that in diabetics may result in ketoacidosis or comma; water retention that in patients with history of congestive heart failure may result in shortness of breath, pulmonary edema, and decompensation with resultant heart failure; weight gain; swelling or edema; medication-induced neural toxicity; particulate matter embolism and blood vessel occlusion with resultant organ, and/or nervous system infarction; and/or aseptic necrosis of one or more joints. Finally, the patient was informed that  Medicine is not an exact science; therefore, there is also the possibility of unforeseen or unpredictable risks and/or possible complications that may result in a catastrophic outcome. The patient indicated having understood very clearly. We have given the patient no guarantees and we have made no promises. Enough time was given to the patient to ask questions, all of which were answered to the patient's satisfaction. Ms. Simson has indicated that she wanted to continue with the procedure. Attestation: I, the ordering provider, attest that I have discussed with the patient the benefits, risks, side-effects, alternatives, likelihood of achieving goals, and potential problems during recovery for the procedure that I have provided informed consent. Date  Time: 11/02/2019  8:05 AM  Pre-Procedure Preparation:  Monitoring: As per clinic protocol. Respiration, ETCO2, SpO2, BP, heart rate and rhythm monitor placed and checked for adequate function Safety Precautions: Patient was assessed for positional comfort and pressure points before starting the procedure. Time-out: I initiated and conducted the "Time-out" before starting the procedure, as per protocol. The patient was asked to participate by confirming the accuracy of the "Time Out" information. Verification of the correct person, site, and procedure were performed and confirmed by me, the nursing staff, and the patient. "Time-out" conducted as per Joint Commission's Universal Protocol (UP.01.01.01). Time: 0829  Description of Procedure:          Laterality: Bilateral. The procedure was performed in identical fashion on both sides. Levels:  L2, L3, L4, L5, & S1 Medial Branch Level(s) Area Prepped: Posterior Lumbosacral Region DuraPrep (Iodine Povacrylex [0.7% available iodine] and Isopropyl Alcohol, 74% w/w) Safety Precautions: Aspiration looking for blood return was conducted prior to all injections. At no point did we inject any substances, as a needle  was being advanced. Before injecting, the patient was told to immediately notify me if she was experiencing any new onset of "ringing in the ears, or metallic taste in the mouth". No attempts were made at seeking any paresthesias. Safe injection practices and needle disposal techniques used. Medications properly checked for expiration dates. SDV (single dose vial) medications used. After the completion of the procedure,  all disposable equipment used was discarded in the proper designated medical waste containers. Local Anesthesia: Protocol guidelines were followed. The patient was positioned over the fluoroscopy table. The area was prepped in the usual manner. The time-out was completed. The target area was identified using fluoroscopy. A 12-in long, straight, sterile hemostat was used with fluoroscopic guidance to locate the targets for each level blocked. Once located, the skin was marked with an approved surgical skin marker. Once all sites were marked, the skin (epidermis, dermis, and hypodermis), as well as deeper tissues (fat, connective tissue and muscle) were infiltrated with a small amount of a short-acting local anesthetic, loaded on a 10cc syringe with a 25G, 1.5-in  Needle. An appropriate amount of time was allowed for local anesthetics to take effect before proceeding to the next step. Local Anesthetic: Lidocaine 2.0% The unused portion of the local anesthetic was discarded in the proper designated containers. Technical explanation of process:  L2 Medial Branch Nerve Block (MBB): The target area for the L2 medial branch is at the junction of the postero-lateral aspect of the superior articular process and the superior, posterior, and medial edge of the transverse process of L3. Under fluoroscopic guidance, a Quincke needle was inserted until contact was made with os over the superior postero-lateral aspect of the pedicular shadow (target area). After negative aspiration for blood, 0.5 mL of the  nerve block solution was injected without difficulty or complication. The needle was removed intact. L3 Medial Branch Nerve Block (MBB): The target area for the L3 medial branch is at the junction of the postero-lateral aspect of the superior articular process and the superior, posterior, and medial edge of the transverse process of L4. Under fluoroscopic guidance, a Quincke needle was inserted until contact was made with os over the superior postero-lateral aspect of the pedicular shadow (target area). After negative aspiration for blood, 0.5 mL of the nerve block solution was injected without difficulty or complication. The needle was removed intact. L4 Medial Branch Nerve Block (MBB): The target area for the L4 medial branch is at the junction of the postero-lateral aspect of the superior articular process and the superior, posterior, and medial edge of the transverse process of L5. Under fluoroscopic guidance, a Quincke needle was inserted until contact was made with os over the superior postero-lateral aspect of the pedicular shadow (target area). After negative aspiration for blood, 0.5 mL of the nerve block solution was injected without difficulty or complication. The needle was removed intact. L5 Medial Branch Nerve Block (MBB): The target area for the L5 medial branch is at the junction of the postero-lateral aspect of the superior articular process and the superior, posterior, and medial edge of the sacral ala. Under fluoroscopic guidance, a Quincke needle was inserted until contact was made with os over the superior postero-lateral aspect of the pedicular shadow (target area). After negative aspiration for blood, 0.5 mL of the nerve block solution was injected without difficulty or complication. The needle was removed intact. S1 Medial Branch Nerve Block (MBB): The target area for the S1 medial branch is at the posterior and inferior 6 o'clock position of the L5-S1 facet joint. Under fluoroscopic  guidance, the Quincke needle inserted for the L5 MBB was redirected until contact was made with os over the inferior and postero aspect of the sacrum, at the 6 o' clock position under the L5-S1 facet joint (Target area). After negative aspiration for blood, 0.5 mL of the nerve block solution was injected without difficulty or  complication. The needle was removed intact.  Nerve block solution: 0.2% PF-Ropivacaine + Triamcinolone (40 mg/mL) diluted to a final concentration of 4 mg of Triamcinolone/mL of Ropivacaine The unused portion of the solution was discarded in the proper designated containers. Procedural Needles: 22-gauge, 7-inch, Quincke needles used for all levels.  Once the entire procedure was completed, the treated area was cleaned, making sure to leave some of the prepping solution back to take advantage of its long term bactericidal properties.   Illustration of the posterior view of the lumbar spine and the posterior neural structures. Laminae of L2 through S1 are labeled. DPRL5, dorsal primary ramus of L5; DPRS1, dorsal primary ramus of S1; DPR3, dorsal primary ramus of L3; FJ, facet (zygapophyseal) joint L3-L4; I, inferior articular process of L4; LB1, lateral branch of dorsal primary ramus of L1; IAB, inferior articular branches from L3 medial branch (supplies L4-L5 facet joint); IBP, intermediate branch plexus; MB3, medial branch of dorsal primary ramus of L3; NR3, third lumbar nerve root; S, superior articular process of L5; SAB, superior articular branches from L4 (supplies L4-5 facet joint also); TP3, transverse process of L3.  Vitals:   11/02/19 0842 11/02/19 0852 11/02/19 0902 11/02/19 0912  BP: (!) 110/91 103/75 100/72 112/73  Pulse:      Resp: 14 15 14 14   Temp:  (!) 97.2 F (36.2 C)  (!) 97.1 F (36.2 C)  SpO2: 93% 98% 100% 100%  Weight:      Height:         Start Time: 0829 hrs. End Time: 0840 hrs.  Imaging Guidance (Spinal):          Type of Imaging Technique:  Fluoroscopy Guidance (Spinal) Indication(s): Assistance in needle guidance and placement for procedures requiring needle placement in or near specific anatomical locations not easily accessible without such assistance. Exposure Time: Please see nurses notes. Contrast: None used. Fluoroscopic Guidance: I was personally present during the use of fluoroscopy. "Tunnel Vision Technique" used to obtain the best possible view of the target area. Parallax error corrected before commencing the procedure. "Direction-depth-direction" technique used to introduce the needle under continuous pulsed fluoroscopy. Once target was reached, antero-posterior, oblique, and lateral fluoroscopic projection used confirm needle placement in all planes. Images permanently stored in EMR. Interpretation: No contrast injected. I personally interpreted the imaging intraoperatively. Adequate needle placement confirmed in multiple planes. Permanent images saved into the patient's record.  Antibiotic Prophylaxis:   Anti-infectives (From admission, onward)   None     Indication(s): None identified  Post-operative Assessment:  Post-procedure Vital Signs:  Pulse/HCG Rate: 6971 Temp: (!) 97.1 F (36.2 C) Resp: 14 BP: 112/73 SpO2: 100 %  EBL: None  Complications: No immediate post-treatment complications observed by team, or reported by patient.  Note: The patient tolerated the entire procedure well. A repeat set of vitals were taken after the procedure and the patient was kept under observation following institutional policy, for this type of procedure. Post-procedural neurological assessment was performed, showing return to baseline, prior to discharge. The patient was provided with post-procedure discharge instructions, including a section on how to identify potential problems. Should any problems arise concerning this procedure, the patient was given instructions to immediately contact us, at any time, without hesitation.  In any case, we plan to contact the patient by telephone for a follow-up status report regarding this interventional procedure.  Comments:  No additional relevant information.  Plan of Care  Orders:  Orders Placed This Encounter  Procedures  . LUMBAR  FACET(MEDIAL BRANCH NERVE BLOCK) MBNB    Scheduling Instructions:     Procedure: Lumbar facet block (AKA.: Lumbosacral medial branch nerve block)     Side: Bilateral     Level: L3-4, L4-5, & L5-S1 Facets (L2, L3, L4, L5, & S1 Medial Branch Nerves)     Sedation: Patient's choice.     Timeframe: Today    Order Specific Question:   Where will this procedure be performed?    Answer:   [facility]  . DG [facility] C-ARM 1-60 MIN NO REPORT    Intraoperative interpretation by procedural physician at [facility].    Standing Status:   Standing    Number of Occurrences:   1    Order Specific Question:   Reason for exam:    Answer:   Assistance in needle guidance and placement for procedures requiring needle placement in or near specific anatomical locations not easily accessible without such assistance.  . Informed Consent Details: Physician/Practitioner Attestation; Transcribe to consent form and obtain patient signature    Nursing Order: Transcribe to consent form and obtain patient signature. Note: Always confirm laterality of pain with Ms. Wherry, before procedure.    Order Specific Question:   Physician/Practitioner attestation of informed consent for procedure/surgical case    Answer:   I, the physician/practitioner, attest that I have discussed with the patient the benefits, risks, side effects, alternatives, likelihood of achieving goals and potential problems during recovery for the procedure that I have provided informed consent.    Order Specific Question:   Procedure    Answer:   Lumbar Facet Block  under fluoroscopic guidance    Order Specific Question:   Physician/Practitioner performing the procedure    Answer:    Zawadi Aplin A. Dossie Arbour MD    Order Specific Question:   Indication/Reason    Answer:   Low Back Pain, with our without leg pain, due to Facet Joint Arthralgia (Joint Pain) Spondylosis (Arthritis of the Spine), without myelopathy or radiculopathy (Nerve Damage).  . Provide equipment / supplies at bedside    "Block Tray" (Disposable  single use) Needle type: SpinalSpinal Amount/quantity: 4 Size: Medium (5-inch) Gauge: 22G    Standing Status:   Standing    Number of Occurrences:   1    Order Specific Question:   Specify    Answer:   Block Tray   Chronic Opioid Analgesic:  None Highest recorded MME/day: 37.5 mg/day MME/day: 0 mg/day   Medications ordered for procedure: Meds ordered this encounter  Medications  . lidocaine (XYLOCAINE) 2 % (with pres) injection 400 mg  . lactated ringers infusion 1,000 mL  . midazolam (VERSED) 5 MG/5ML injection 1-2 mg    Make sure Flumazenil is available in the pyxis when using this medication. If oversedation occurs, administer 0.2 mg IV over 15 sec. If after 45 sec no response, administer 0.2 mg again over 1 min; may repeat at 1 min intervals; not to exceed 4 doses (1 mg)  . fentaNYL (SUBLIMAZE) injection 25-50 mcg    Make sure Narcan is available in the pyxis when using this medication. In the event of respiratory depression (RR< 8/min): Titrate NARCAN (naloxone) in increments of 0.1 to 0.2 mg IV at 2-3 minute intervals, until desired degree of reversal.  . ropivacaine (PF) 2 mg/mL (0.2%) (NAROPIN) injection 18 mL  . triamcinolone acetonide (KENALOG-40) injection 80 mg   Medications administered: We administered lidocaine, lactated ringers, midazolam, fentaNYL, ropivacaine (PF) 2 mg/mL (0.2%), and triamcinolone acetonide.  See the medical record for exact dosing, route, and time of administration.  Follow-up plan:   Return in about 2 weeks (around 11/16/2019) for (VV), (PP) Follow-up.       Interventional management options: Planned, scheduled,  and/or pending:       Considering:   Diagnostic left L4-5 LESI #1  Diagnostic left L4 TFESI + Left L4-5 LESI #1    PRN Procedures:   Diagnostic bilateral lumbar facet block #2      Recent Visits Date Type Provider Dept  10/12/19 Telemedicine Milinda Pointer, MD [facility]  09/21/19 Procedure visit Milinda Pointer, MD [facility]  08/31/19 Office Visit Milinda Pointer, MD [facility]  08/17/19 Procedure visit Milinda Pointer, MD [facility]  Showing recent visits within past 90 days and meeting all other requirements Today's Visits Date Type Provider Dept  11/02/19 Procedure visit Milinda Pointer, MD [facility]  Showing today's visits and meeting all other requirements Future Appointments Date Type Provider Dept  11/17/19 Appointment Milinda Pointer, MD [facility]  Showing future appointments within next 90 days and meeting all other requirements  Disposition: Discharge home  Discharge (Date  Time): 11/02/2019; 0914 hrs.   Primary Care Physician: Pearletha Alfred, PA Location: [facility] Note by: Gaspar Cola, MD Date: 11/02/2019; Time: 10:49 AM  Disclaimer:  Medicine is not an Chief Strategy Officer. The only guarantee in medicine is that nothing is guaranteed. It is important to note that the decision to proceed with this intervention was based on the information collected from the patient. The Data and conclusions were drawn from the patient's questionnaire, the interview, and the physical examination. Because the information was provided in large part by the patient, it cannot be guaranteed that it has not been purposely or unconsciously manipulated. Every effort has been made to obtain as much relevant data as possible for this evaluation. It is important to note that the conclusions that lead to this procedure are derived in large part from the available data. Always  take into account that the treatment will also be dependent on availability of resources and existing treatment guidelines, considered by other Pain Management Practitioners as being common knowledge and practice, at the time of the intervention. For Medico-Legal purposes, it is also important to point out that variation in procedural techniques and pharmacological choices are the acceptable norm. The indications, contraindications, technique, and results of the above procedure should only be interpreted and judged by a Board-Certified Interventional Pain Specialist with extensive familiarity and expertise in the same exact procedure and technique.

## 2019-11-02 NOTE — Patient Instructions (Signed)

## 2019-11-03 ENCOUNTER — Telehealth: Payer: Self-pay

## 2019-11-03 NOTE — Telephone Encounter (Signed)
Post procedure phone call.  Patient states she is doing well.  

## 2019-11-17 ENCOUNTER — Ambulatory Visit: Payer: Managed Care, Other (non HMO) | Admitting: Pain Medicine

## 2019-11-17 ENCOUNTER — Other Ambulatory Visit: Payer: Self-pay

## 2019-11-17 ENCOUNTER — Ambulatory Visit: Payer: Managed Care, Other (non HMO) | Attending: Pain Medicine | Admitting: Pain Medicine

## 2019-11-17 DIAGNOSIS — M545 Low back pain, unspecified: Secondary | ICD-10-CM | POA: Diagnosis not present

## 2019-11-17 DIAGNOSIS — G8929 Other chronic pain: Secondary | ICD-10-CM

## 2019-11-17 DIAGNOSIS — Z6841 Body Mass Index (BMI) 40.0 and over, adult: Secondary | ICD-10-CM

## 2019-11-17 DIAGNOSIS — G894 Chronic pain syndrome: Secondary | ICD-10-CM

## 2019-11-17 DIAGNOSIS — M47816 Spondylosis without myelopathy or radiculopathy, lumbar region: Secondary | ICD-10-CM

## 2019-11-17 DIAGNOSIS — M79605 Pain in left leg: Secondary | ICD-10-CM

## 2019-11-17 NOTE — Patient Instructions (Signed)
______________________________________________________________________________________________  Weight Management Required  URGENT: Your weight has been found to be adversely affecting your health.  Dear Marisa Lewis:  Your current Estimated body mass index is 44.4 kg/m as calculated from the following:   Height as of 11/02/19: $RemoveBefo'5\' 1"'UjcitHJSjzh$  (1.549 m).   Weight as of 11/02/19: 235 lb (106.6 kg).  Please use the table below to identify your weight category and associated incidence of chronic pain, secondary to your weight.  Body Mass Index (BMI) Classification BMI level (kg/m2) Category Associated incidence of chronic pain  <18  Underweight   18.5-24.9 Ideal body weight   25-29.9 Overweight  20%  30-34.9 Obese (Class I)  68%  35-39.9 Severe obesity (Class II)  136%  >40 Extreme obesity (Class III)  254%   In addition: You will be considered "Morbidly Obese", if your BMI is above 30 and you have one or more of the following conditions which are known to be caused and/or directly associated with obesity: 1.    Type 2 Diabetes (Which in turn can lead to cardiovascular diseases (CVD), stroke, peripheral vascular diseases (PVD), retinopathy, nephropathy, and neuropathy) 2.    Cardiovascular Disease (High Blood Pressure; Congestive Heart Failure; High Cholesterol; Coronary Artery Disease; Angina; or History of Heart Attacks) 3.    Breathing problems (Asthma; obesity-hypoventilation syndrome; obstructive sleep apnea; chronic inflammatory airway disease; reactive airway disease; or shortness of breath) 4.    Chronic kidney disease 5.    Liver disease (nonalcoholic fatty liver disease) 6.    High blood pressure 7.    Acid reflux (gastroesophageal reflux disease; heartburn) 8.    Osteoarthritis (OA) (with any of the following: hip pain; knee pain; and/or low back pain) 9.    Low back pain (Lumbar Facet Syndrome; and/or Degenerative Disc Disease) 10.  Hip pain (Osteoarthritis of hip) (For every 1 lbs of  added body weight, there is a 2 lbs increase in pressure inside of each hip articulation. 1:2 mechanical relationship) 11.  Knee pain (Osteoarthritis of knee) (For every 1 lbs of added body weight, there is a 4 lbs increase in pressure inside of each knee articulation. 1:4 mechanical relationship) (patients with a BMI>30 kg/m2 were 6.8 times more likely to develop knee OA than normal-weight individuals) 12.  Cancer: Epidemiological studies have shown that obesity is a risk factor for: post-menopausal breast cancer; cancers of the endometrium, colon and kidney cancer; malignant adenomas of the oesophagus. Obese subjects have an approximately 1.5-3.5-fold increased risk of developing these cancers compared with normal-weight subjects, and it has been estimated that between 15 and 45% of these cancers can be attributed to overweight. More recent studies suggest that obesity may also increase the risk of other types of cancer, including pancreatic, hepatic and gallbladder cancer. (Ref: Obesity and cancer. Pischon T, Nthlings U, [employer] H. Proc Nutr Soc. 2008 May;67(2):128-45. doi: 20.2542/H0623762831517616.) The International Agency for Research on Cancer (IARC) has identified 13 cancers associated with overweight and obesity: meningioma, multiple myeloma, adenocarcinoma of the esophagus, and cancers of the thyroid, postmenopausal breast cancer, gallbladder, stomach, liver, pancreas, kidney, ovaries, uterus, colon and rectal (colorectal) cancers. 39 percent of all cancers diagnosed in women and 24 percent of those diagnosed in men are associated with overweight and obesity.  Recommendation: At this point it is urgent that you take a step back and concentrate in loosing weight. Dedicate 100% of your efforts on this task. Nothing else will improve your health more than bringing your weight down and your BMI to less  than 30. If you are here, you probably have chronic pain. We know that most chronic pain patients have  difficulty exercising secondary to their pain. For this reason, you must rely on proper nutrition and diet in order to lose the weight. If your BMI is above 40, you should seriously consider bariatric surgery. A realistic goal is to lose 10% of your body weight over a period of 12 months.  Be honest to yourself, if over time you have unsuccessfully tried to lose weight, then it is time for you to seek professional help and to enter a medically supervised weight management program, and/or undergo bariatric surgery. Stop procrastinating.   Pain management considerations:  1.    Pharmacological Problems: Be advised that the use of opioid analgesics (oxycodone; hydrocodone; morphine; methadone; codeine; and all of their derivatives) have been associated with decreased metabolism and weight gain.  For this reason, should we see that you are unable to lose weight while taking these medications, it may become necessary for [place] to taper down and indefinitely discontinue them.  2.    Technical Problems: The incidence of successful interventional therapies decreases as the patient's BMI increases. It is much more difficult to accomplish a safe and effective interventional therapy on a patient with a BMI above 35. 3.    Radiation Exposure Problems: The x-rays machine, used to accomplish injection therapies, will automatically increase their x-ray output in order to capture an appropriate bone image. This means that radiation exposure increases exponentially with the patient's BMI. (The higher the BMI, the higher the radiation exposure.) Although the level of radiation used at a given time is still safe to the patient, it is not for the physician and/or assisting staff. Unfortunately, radiation exposure is accumulative. Because physicians and the staff have to do procedures and be exposed on a daily basis, this can result in health problems such as cancer and radiation burns. Radiation exposure to the staff is monitored by  the radiation batches that they wear. The exposure levels are reported back to the staff on a quarterly basis. Depending on levels of exposure, physicians and staff may be obligated by law to decrease this exposure. This means that they have the right and obligation to refuse providing therapies where they may be overexposed to radiation. For this reason, physicians may decline to offer therapies such as radiofrequency ablation or implants to patients with a BMI above 40. 4.    Current Trends: Be advised that the current trend is to no longer offer certain therapies to patients with a BMI equal to, or above 35, due to increase perioperative risks, increased technical procedural difficulties, and excessive radiation exposure to healthcare personnel.  ______________________________________________________________________________________________

## 2019-11-17 NOTE — Progress Notes (Signed)
Patient: Marisa Lewis  Service Category: E/M  Provider: Gaspar Cola, MD  DOB: 23-Aug-1965  DOS: 11/17/2019  Location: Office  MRN: [id number]  Setting: Ambulatory outpatient  Referring Provider: Pearletha Alfred, PA  Type: Established Patient  Specialty: Interventional Pain Management  PCP: Pearletha Alfred, PA  Location: Remote location  Delivery: TeleHealth     Virtual Encounter - Pain Management PROVIDER NOTE: Information contained herein reflects review and annotations entered in association with encounter. Interpretation of such information and data should be left to medically-trained personnel. Information provided to patient can be located elsewhere in the medical record under "Patient Instructions". Document created using STT-dictation technology, any transcriptional errors that may result from process are unintentional.    Contact & Pharmacy Preferred: [phone] Home: [phone] (home) Mobile: [phone] (mobile) E-mail: [email or web address] .[facility] [phone] [address], [place] [address] [place] [address] Phone: [phone] Fax: [phone]   Pre-screening  Marisa Lewis offered "in-person" vs "virtual" encounter. She indicated preferring virtual for this encounter.   Reason COVID-19*  Social distancing based on CDC and AMA recommendations.   I contacted Marisa Lewis on 11/17/2019 via telephone.      I clearly identified myself as Gaspar Cola, MD. I verified that I was speaking with the correct person using two identifiers (Name: Marisa Lewis, and date of birth: 1965-08-19).  Consent I sought verbal advanced consent from Marisa Lewis for virtual visit interactions. I informed Marisa Lewis of possible security and privacy concerns, risks, and limitations associated with providing "not-in-person" medical evaluation and management services. I also informed Marisa Lewis of the availability of  "in-person" appointments. Finally, I informed her that there would be a charge for the virtual visit and that she could be  personally, fully or partially, financially responsible for it. Marisa Lewis expressed understanding and agreed to proceed.   Historic Elements   Marisa Lewis is a 54 y.o. year old, female patient evaluated today after our last contact on 11/02/2019. Marisa Lewis  has a past medical history of Asthma, GERD (gastroesophageal reflux disease), Migraine, and Sciatica of left side. She also  has a past surgical history that includes Gastric bypass (1997 and 2005); Abdominal hysterectomy; Umbilical hernia repair; Colonoscopy with propofol (N/A, 06/16/2018); polypectomy (N/A, 06/16/2018); and Splenectomy, total. Marisa Lewis has a current medication list which includes the following prescription(s): albuterol, cetirizine, citalopram, cyclobenzaprine, eletriptan, ferrous sulfate, flavoxate, fluticasone, gabapentin, vitron-c, naproxen, omeprazole, promethazine hcl, saxenda, sumatriptan, topiramate, tramadol, triamterene-hydrochlorothiazide, vitamin b-12, vitamin d (ergocalciferol), and zolpidem. She  reports that she quit smoking about 4 years ago. She has never used smokeless tobacco. She reports that she does not drink alcohol and does not use drugs. Marisa Lewis is allergic to penicillins, aspirin, and morphine and related.   HPI  Today, she is being contacted for a post-procedure assessment.  The patient indicates that she attained 100% relief of the pain for the duration of the local anesthetic and after they wore off, then the pain started coming back.  She also indicates that when she first wakes up in the morning the pain is really not that bad and she can appreciate having about a 90% improvement after the procedure done on 11/02/2019.  However, as the day goes on and she spends more time on her feet, then her low back pain begins to worsen to the current 70% ongoing improvement that she is  described.  Today I had a long conversation with the patient regarding the mechanism of this pain and the fact that we have confirmed that her low back pain that she is experiencing is coming from her lumbar facets, bilaterally.  Based on the patient's diagnosis and response to the diagnostic injections, she would be an excellent candidate for radiofrequency ablation for the purpose of extending on the relief of the above-mentioned procedures.  However, currently her BMI is between 40 and 44.9 kg/m.  This makes it technically difficult for [place] to do the radiofrequency and we have already learned that it also decreases the effectiveness of the procedure.  For this reason, I prefer to do the radiofrequency ablation on patient's that have a BMI of 35 or less.  Today I have encouraged patient to work on this.  She has already had bariatric surgery and she has gone down on her weight from 300+ to 235 pounds.  However, she feels that the bariatric surgery that she had in 2015 needs to be reevaluated and perhaps revised.  For this reason, today we are giving her a referral to a bariatric surgeon and we are also giving her a referral to a medical weight management clinic to see if they can assist her in achieving our goal of bringing her weight down to a BMI of 30 or less.  For the purpose of the radiofrequency we need her to at least go down to less than 35 kg/m.  The patient understood and accepted.  Today we also talked about what to do in terms of palliative care and we will continue to offer her the lumbar facet injections for the treatment of her lumbar facet syndrome and should she begin to experience problems with radiculopathy secondary to her L4-5 and L5-S1 spinal stenosis, then we will treat those with lumbar epidural steroid injections and/for transforaminal epidural steroid injections, depending on the level on the side affected at the time.  Once again she understood and accepted.  Post-Procedure  Evaluation  Procedure (11/02/2019): Diagnostic bilateral lumbar facet block #2 under fluoroscopic guidance and IV sedation Pre-procedure pain level: 3/10 Post-procedure: 0/10 (100% relief)  Sedation: Sedation provided.  Effectiveness during initial hour after procedure(Ultra-Short Term Relief): 100 %.  Local anesthetic used: Long-acting (4-6 hours) Effectiveness: Defined as any analgesic benefit obtained secondary to the administration of local anesthetics. This carries significant diagnostic value as to the etiological location, or anatomical origin, of the pain. Duration of benefit is expected to coincide with the duration of the local anesthetic used.  Effectiveness during initial 4-6 hours after procedure(Short-Term Relief): 100 % (numb for 2 - 3 hours.  pain with excessive movement.).  Long-term benefit: Defined as any relief past the pharmacologic duration of the local anesthetics.  Effectiveness past the initial 6 hours after procedure(Long-Term Relief): 70 % (with activity).  Current benefits: Defined as benefit that persist at this time.   Analgesia:  70-90% better Function: Ms. Stanke reports improvement in function ROM: Ms. Sterling reports improvement in ROM  Pharmacotherapy Assessment  Analgesic: None Highest recorded MME/day: 37.5 mg/day MME/day: 0 mg/day   Monitoring: [place] PMP: PDMP reviewed during this encounter.       Pharmacotherapy: No side-effects or adverse reactions reported. Compliance: No problems identified. Effectiveness: Clinically acceptable. Plan: Refer to "POC".  UDS:  Summary  Date Value Ref Range Status  06/26/2019 Note  Final    Comment:    ==================================================================== [employer] Select 13 (MW) ==================================================================== Test  Result       Flag       Units  Drug Present and Declared for Prescription Verification   Tramadol                        558          EXPECTED   ng/mg creat   O-Desmethyltramadol            3849         EXPECTED   ng/mg creat   N-Desmethyltramadol            472          EXPECTED   ng/mg creat    Source of tramadol is a prescription medication. O-desmethyltramadol    and N-desmethyltramadol are expected metabolites of tramadol.  ==================================================================== Test                      Result    Flag   Units      Ref Range   Creatinine              57               mg/dL      >=20 ==================================================================== Declared Medications:  The flagging and interpretation on this report are based on the  following declared medications.  Unexpected results may arise from  inaccuracies in the declared medications.   **Note: The testing scope of this panel includes these medications:   Tramadol (Ultram)   **Note: The testing scope of this panel does not include the  following reported medications:   Albuterol (Ventolin HFA)  Cetirizine (Zyrtec)  Cyanocobalamin  Cyclobenzaprine (Flexeril)  Eletriptan (Relpax)  Fluticasone (Flonase)  Gabapentin (Neurontin)  Iron  Liraglutide (Saxenda)  Omeprazole (Prilosec)  Promethazine  Sumatriptan (Imitrex)  Topiramate (Topamax)  Vitamin D2 (Drisdol)  Zolpidem (Ambien) ==================================================================== For clinical consultation, please call [phone]. ====================================================================     Laboratory Chemistry Profile   Renal Lab Results  Component Value Date   BUN 16 06/28/2019   CREATININE 0.78 06/28/2019   GFRAA >60 06/28/2019   GFRNONAA >60 06/28/2019     Hepatic Lab Results  Component Value Date   AST 22 06/28/2019   ALT 15 06/28/2019   ALBUMIN 4.0 06/28/2019   ALKPHOS 55 06/28/2019     Electrolytes Lab Results  Component Value Date   NA 139 06/28/2019   K 3.6 06/28/2019   CL 103 06/28/2019    CALCIUM 9.1 06/28/2019   MG 2.4 06/28/2019     Bone Lab Results  Component Value Date   VD25OH 73.07 06/28/2019     Inflammation (CRP: Acute Phase) (ESR: Chronic Phase) Lab Results  Component Value Date   CRP 0.6 06/28/2019   ESRSEDRATE 9 06/28/2019       Note: Above Lab results reviewed.  Imaging  DG PAIN CLINIC C-ARM 1-60 MIN NO REPORT Fluoro was used, but no Radiologist interpretation will be provided.  Please refer to "NOTES" tab for provider progress note.  Assessment  The primary encounter diagnosis was Chronic pain syndrome. 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  Plan of Care  Problem-specific:  No problem-specific Assessment & Plan notes found for this encounter.  Ms. Semiah  MASYN FULLAM has a current medication list which includes the following long-term medication(s): albuterol, cetirizine, citalopram, eletriptan, ferrous sulfate, fluticasone, gabapentin, omeprazole, promethazine hcl, sumatriptan, topiramate, triamterene-hydrochlorothiazide, and zolpidem.  Pharmacotherapy (Medications Ordered): No orders of the defined types were placed in this encounter.  Orders:  Orders Placed This Encounter  Procedures  . Amb Ref to Medical Weight Management    Referral Priority:   Routine    Referral Type:   Consultation    Referral Reason:   Specialty Services Required    Number of Visits Requested:   1  . Amb Referral to Bariatric Surgery    Referral Priority:   Routine    Referral Type:   Consultation    Referral Reason:   Specialty Services Required    Number of Visits Requested:   1   Follow-up plan:   Return if symptoms worsen or fail to improve.      Interventional management options: Planned, scheduled, and/or pending:        Considering:   Diagnostic left L4-5 LESI #1  Diagnostic left L4 TFESI + Left L4-5 LESI #1    PRN Procedures:   Diagnostic bilateral lumbar facet block #3 (100/100/75) (100/100/70/70-90)    Recent Visits Date Type Provider Dept  11/02/19 Procedure visit Milinda Pointer, MD [facility]  10/12/19 Telemedicine Milinda Pointer, MD [facility]  09/21/19 Procedure visit Milinda Pointer, MD [facility]  08/31/19 Office Visit Milinda Pointer, MD [facility]  Showing recent visits within past 90 days and meeting all other requirements Today's Visits Date Type Provider Dept  11/17/19 Telemedicine Milinda Pointer, MD [facility]  Showing today's visits and meeting all other requirements Future Appointments No visits were found meeting these conditions. Showing future appointments within next 90 days and meeting all other requirements  I discussed the assessment and treatment plan with the patient. The patient was provided an opportunity to ask questions and all were answered. The patient agreed with the plan and demonstrated an understanding of the instructions.  Patient advised to call back or seek an in-person evaluation if the symptoms or condition worsens.  Duration of encounter: 18 minutes.  Note by: Gaspar Cola, MD Date: 11/17/2019; Time: 11:18 AM

## 2020-10-30 ENCOUNTER — Ambulatory Visit
Admission: EM | Admit: 2020-10-30 | Discharge: 2020-10-30 | Disposition: A | Discharge status: Home / Self Care | Payer: Managed Care, Other (non HMO) | Attending: Internal Medicine | Admitting: Internal Medicine

## 2020-10-30 ENCOUNTER — Other Ambulatory Visit: Payer: Self-pay

## 2020-10-30 ENCOUNTER — Encounter: Payer: Self-pay | Admitting: Emergency Medicine

## 2020-10-30 DIAGNOSIS — T148XXA Other injury of unspecified body region, initial encounter: Secondary | ICD-10-CM | POA: Diagnosis not present

## 2020-10-30 NOTE — Discharge Instructions (Signed)
Icing of the area involved is advised Continue taking Tylenol extra strength Gentle range of motion exercises Return to urgent care if symptoms worsen.

## 2020-10-30 NOTE — ED Provider Notes (Signed)
[facility]    CSN: [id number] Arrival date & time: 10/30/20  1356      History   Chief Complaint Chief Complaint  Patient presents with   Fall    HPI Marisa Lewis is a 55 y.o. female comes to the urgent care with accidental fall 3 days ago.  Patient lost her balance and fell hitting her left side against a door frame.  She is complaining of left shoulder pain and left gluteal pain.  Pain is of moderate severity.  Patient is able to ambulate.  No difficulty in moving left upper extremity.  No radiation of pain.  Patient has tried Tylenol for pain.  She denies any headaches, dizziness, nausea or vomiting.  No loss of consciousness.Marland Kitchen   HPI  Past Medical History:  Diagnosis Date   Asthma    rare   GERD (gastroesophageal reflux disease)    Migraine    approx 1x/wk   Sciatica of left side     Patient Active Problem List   Diagnosis Date Noted   Spondylosis without myelopathy or radiculopathy, lumbosacral region 09/21/2019   Elevated vitamin B12 level 07/28/2019   Chronic pain syndrome 06/28/2019   Pharmacologic therapy 06/28/2019   Disorder of skeletal system 06/28/2019   Problems influencing health status 06/28/2019   Chronic lower extremity pain (1ry area of Pain) (Left) 06/28/2019   Chronic low back pain (2ry area of Pain) (Left) w/o sciatica 06/28/2019   Lumbar facet joint syndrome (Bilateral) (L>R) 06/28/2019   Chronic sacroiliac joint pain (Right) 06/28/2019   DDD (degenerative disc disease), lumbar 06/28/2019   Abnormal MRI, lumbar spine (05/21/2019) 06/28/2019   Lumbar central spinal stenosis, 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, L5-S1) 06/28/2019   Lumbosacral lateral recess stenosis (L5-S1) (Right) 06/28/2019   Lumbosacral facet arthropathy (L4-5) (R>L) 06/28/2019   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/28/2019   Abdominal pain 06/27/2019   Gastroesophageal reflux disease 06/27/2019   Left lower quadrant pain 06/27/2019   Morbid obesity ([place]) 06/27/2019    Rectal bleeding    Polyp of sigmoid colon    Internal hemorrhoids    Morbid obesity with BMI of 40.0-44.9, adult ([place]) 03/12/2018    Past Surgical History:  Procedure Laterality Date   ABDOMINAL HYSTERECTOMY     COLONOSCOPY WITH PROPOFOL N/A 06/16/2018   Procedure: COLONOSCOPY WITH BIOPSIES;  Surgeon: Virgel Manifold, MD;  Location: [facility];  Service: Endoscopy;  Laterality: N/A;   GASTRIC BYPASS  1997 and 2005   POLYPECTOMY N/A 06/16/2018   Procedure: POLYPECTOMY;  Surgeon: Virgel Manifold, MD;  Location: [facility];  Service: Endoscopy;  Laterality: N/A;   SPLENECTOMY, TOTAL     UMBILICAL HERNIA REPAIR      OB History   No obstetric history on file.      Home Medications    Prior to Admission medications   Medication Sig Start Date End Date Taking? Authorizing Provider  albuterol (PROVENTIL HFA;VENTOLIN HFA) 108 (90 Base) MCG/ACT inhaler Inhale into the lungs every 6 (six) hours as needed for wheezing or shortness of breath.   Yes [provider]  cetirizine (ZYRTEC) 10 MG tablet Take 10 mg by mouth daily.   Yes [provider]  cyclobenzaprine (FLEXERIL) 10 MG tablet Take 10 mg by mouth 3 (three) times daily as needed for muscle spasms.   Yes [provider]  eletriptan (RELPAX) 40 MG tablet Take 40 mg by mouth as needed for migraine or headache. May repeat in 2 hours if headache  persists or recurs.   Yes [provider]  ferrous sulfate 325 (65 FE) MG tablet Take 325 mg by mouth daily with breakfast.   Yes [provider]  flavoxATE (URISPAS) 100 MG tablet Take 100 mg by mouth as needed.   Yes [provider]  fluticasone (FLONASE) 50 MCG/ACT nasal spray Place 2 sprays into both nostrils daily.   Yes [provider]  gabapentin (NEURONTIN) 300 MG capsule Take 300 mg by mouth 3 (three) times daily.   Yes [provider]  Iron-Vitamin C (VITRON-C) 65-125 MG TABS Take 1 tablet by  mouth daily. 06/07/16  Yes [provider]  naproxen (NAPROSYN) 500 MG tablet Take 1 tablet (500 mg total) by mouth 2 (two) times daily. 07/30/19  Yes Luvenia Redden, PA-C  omeprazole (PRILOSEC) 40 MG capsule Take 40 mg by mouth daily.   Yes [provider]  Promethazine HCl (PHENERGAN PO) Take 10 mg by mouth.   Yes [provider]  SAXENDA 18 MG/3ML SOPN  09/19/17  Yes [provider]  SUMAtriptan (IMITREX) 6 MG/0.5ML SOLN injection Inject 6 mg into the skin every 2 (two) hours as needed for migraine or headache. May repeat in 2 hours if headache persists or recurs.   Yes [provider]  topiramate (TOPAMAX) 100 MG tablet Take 100 mg by mouth 2 (two) times daily.   Yes [provider]  traMADol (ULTRAM) 50 MG tablet Take 1 tablet (50 mg total) by mouth every 8 (eight) hours as needed. 07/15/18  Yes Cook, Jayce G, DO  triamterene-hydrochlorothiazide (MAXZIDE-25) 37.5-25 MG tablet Take 1 tablet by mouth daily. 07/01/19  Yes [provider]  vitamin B-12 (CYANOCOBALAMIN) 1000 MCG tablet Take 2,000 mcg by mouth daily.   Yes [provider]  Vitamin D, Ergocalciferol, (DRISDOL) 1.25 MG (50000 UT) CAPS capsule Take 50,000 Units by mouth every 7 (seven) days.  02/16/18  Yes [provider]  zolpidem (AMBIEN) 5 MG tablet Take 5 mg by mouth at bedtime as needed for sleep.   Yes [provider]  citalopram (CELEXA) 40 MG tablet Take 40 mg by mouth daily. 07/05/19   [provider]    Family History Family History  Problem Relation Age of Onset   Kidney failure Mother    Diabetes Mother    Hyperlipidemia Mother    COPD Mother    Diabetes Father     Social History Social History   Tobacco Use   Smoking status: Former    Types: Cigarettes    Quit date: 12/31/2014    Years since quitting: 5.8   Smokeless tobacco: Never  Vaping Use   Vaping Use: Never used  Substance Use Topics   Alcohol use: No    Drug use: No     Allergies   Penicillins, Aspirin, and Morphine and related   Review of Systems Review of Systems  Constitutional: Negative.   Musculoskeletal:  Positive for myalgias. Negative for arthralgias, back pain, gait problem and joint swelling.  Skin:  Positive for color change.    Physical Exam Triage Vital Signs ED Triage Vitals  Enc Vitals Group     BP 10/30/20 1458 111/78     Pulse Rate 10/30/20 1458 74     Resp --      Temp 10/30/20 1458 98.3 F (36.8 C)     Temp Source 10/30/20 1458 Oral     SpO2 10/30/20 1458 98 %     Weight --  Height --      Head Circumference --      Peak Flow --      Pain Score 10/30/20 1456 4     Pain Loc --      Pain Edu? --      Excl. in [place]? --    No data found.  Updated Vital Signs BP 111/78   Pulse 74   Temp 98.3 F (36.8 C) (Oral)   SpO2 98%   Visual Acuity Right Eye Distance:   Left Eye Distance:   Bilateral Distance:    Right Eye Near:   Left Eye Near:    Bilateral Near:     Physical Exam Vitals and nursing note reviewed.  Constitutional:      General: She is not in acute distress.    Appearance: She is not ill-appearing.  Cardiovascular:     Rate and Rhythm: Normal rate and regular rhythm.  Musculoskeletal:        General: Normal range of motion.     Comments: Large bruise over the left gluteal muscle.  Full range of motion around the left hip joint.  Mild bruise on the anterior aspect of the left shoulder.  Full range of motion of the left shoulder.  Neurological:     Mental Status: She is alert.     UC Treatments / Results  Labs (all labs ordered are listed, but only abnormal results are displayed) Labs Reviewed - No data to display  EKG   Radiology No results found.  Procedures Procedures (including critical care time)  Medications Ordered in UC Medications - No data to display  Initial Impression / Assessment and Plan / UC Course  I have reviewed the triage vital signs and the  nursing notes.  Pertinent labs & imaging results that were available during my care of the patient were reviewed by me and considered in my medical decision making (see chart for details).     1.  Hematoma in contusion of the left gluteus maximus: Icing of the contused area Tylenol as needed for pain Patient cannot take NSAIDs because of a history of gastric bypass Gentle range of motion exercises No indication for x-ray Return to urgent care if symptoms worsen. Final Clinical Impressions(s) / UC Diagnoses   Final diagnoses:  Muscle contusion     Discharge Instructions      Icing of the area involved is advised Continue taking Tylenol extra strength Gentle range of motion exercises Return to urgent care if symptoms worsen.   ED Prescriptions   None    PDMP not reviewed this encounter.   Chase Picket, MD 10/30/20 1600

## 2020-10-30 NOTE — ED Triage Notes (Signed)
Pt slipped and fell at home 3 days ago. Pt c/o left Hip pain, left shoulder and right side abdomen pain.

## 2020-12-05 ENCOUNTER — Ambulatory Visit
Admission: EM | Admit: 2020-12-05 | Discharge: 2020-12-05 | Disposition: A | Discharge status: Home / Self Care | Payer: Managed Care, Other (non HMO) | Attending: Emergency Medicine | Admitting: Emergency Medicine

## 2020-12-05 ENCOUNTER — Encounter: Payer: Self-pay | Admitting: Emergency Medicine

## 2020-12-05 DIAGNOSIS — B349 Viral infection, unspecified: Secondary | ICD-10-CM | POA: Diagnosis not present

## 2020-12-05 DIAGNOSIS — R1030 Lower abdominal pain, unspecified: Secondary | ICD-10-CM | POA: Diagnosis not present

## 2020-12-05 NOTE — ED Provider Notes (Signed)
[facility]    CSN: [id number] Arrival date & time: 12/05/20  1732      History   Chief Complaint Chief Complaint  Patient presents with   Cough   Nasal Congestion    HPI Marisa Lewis is a 55 y.o. female.  Patient presents with 3 to 4-day history runny nose, congestion, cough, nausea.  Treatment attempted at home antihistamine and cough syrup.  She denies fever, rash, shortness of breath, vomiting, diarrhea, or other symptoms.  She also reports right "stinging" sensation in her right lower abdomen where she previously had bowel surgery; this sensation occurs with coughing.  Her medical history includes asthma, morbid obesity, chronic pain syndrome.  The history is provided by the patient and medical records.   Past Medical History:  Diagnosis Date   Asthma    rare   GERD (gastroesophageal reflux disease)    Migraine    approx 1x/wk   Sciatica of left side     Patient Active Problem List   Diagnosis Date Noted   Spondylosis without myelopathy or radiculopathy, lumbosacral region 09/21/2019   Elevated vitamin B12 level 07/28/2019   Chronic pain syndrome 06/28/2019   Pharmacologic therapy 06/28/2019   Disorder of skeletal system 06/28/2019   Problems influencing health status 06/28/2019   Chronic lower extremity pain (1ry area of Pain) (Left) 06/28/2019   Chronic low back pain (2ry area of Pain) (Left) w/o sciatica 06/28/2019   Lumbar facet joint syndrome (Bilateral) (L>R) 06/28/2019   Chronic sacroiliac joint pain (Right) 06/28/2019   DDD (degenerative disc disease), lumbar 06/28/2019   Abnormal MRI, lumbar spine (05/21/2019) 06/28/2019   Lumbar central spinal stenosis, 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, L5-S1) 06/28/2019   Lumbosacral lateral recess stenosis (L5-S1) (Right) 06/28/2019   Lumbosacral facet arthropathy (L4-5) (R>L) 06/28/2019   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/28/2019   Abdominal pain 06/27/2019   Gastroesophageal reflux disease 06/27/2019    Left lower quadrant pain 06/27/2019   Morbid obesity ([place]) 06/27/2019   Rectal bleeding    Polyp of sigmoid colon    Internal hemorrhoids    Morbid obesity with BMI of 40.0-44.9, adult ([place]) 03/12/2018    Past Surgical History:  Procedure Laterality Date   ABDOMINAL HYSTERECTOMY     COLONOSCOPY WITH PROPOFOL N/A 06/16/2018   Procedure: COLONOSCOPY WITH BIOPSIES;  Surgeon: Virgel Manifold, MD;  Location: [facility];  Service: Endoscopy;  Laterality: N/A;   GASTRIC BYPASS  1997 and 2005   POLYPECTOMY N/A 06/16/2018   Procedure: POLYPECTOMY;  Surgeon: Virgel Manifold, MD;  Location: [facility];  Service: Endoscopy;  Laterality: N/A;   SPLENECTOMY, TOTAL     UMBILICAL HERNIA REPAIR      OB History   No obstetric history on file.      Home Medications    Prior to Admission medications   Medication Sig Start Date End Date Taking? Authorizing Provider  albuterol (PROVENTIL HFA;VENTOLIN HFA) 108 (90 Base) MCG/ACT inhaler Inhale into the lungs every 6 (six) hours as needed for wheezing or shortness of breath.    [provider]  cetirizine (ZYRTEC) 10 MG tablet Take 10 mg by mouth daily.    [provider]  citalopram (CELEXA) 40 MG tablet Take 40 mg by mouth daily. 07/05/19   [provider]  cyclobenzaprine (FLEXERIL) 10 MG tablet Take 10 mg by mouth 3 (three) times daily as needed for muscle spasms.    [provider]  eletriptan (RELPAX) 40 MG tablet Take 40  mg by mouth as needed for migraine or headache. May repeat in 2 hours if headache persists or recurs.    [provider]  ferrous sulfate 325 (65 FE) MG tablet Take 325 mg by mouth daily with breakfast.    [provider]  flavoxATE (URISPAS) 100 MG tablet Take 100 mg by mouth as needed.    [provider]  fluticasone (FLONASE) 50 MCG/ACT nasal spray Place 2 sprays into both nostrils daily.    [provider]  gabapentin (NEURONTIN)  300 MG capsule Take 300 mg by mouth 3 (three) times daily.    [provider]  Iron-Vitamin C (VITRON-C) 65-125 MG TABS Take 1 tablet by mouth daily. 06/07/16   [provider]  naproxen (NAPROSYN) 500 MG tablet Take 1 tablet (500 mg total) by mouth 2 (two) times daily. 07/30/19   Luvenia Redden, PA-C  omeprazole (PRILOSEC) 40 MG capsule Take 40 mg by mouth daily.    [provider]  Promethazine HCl (PHENERGAN PO) Take 10 mg by mouth.    [provider]  SAXENDA 18 MG/3ML SOPN  09/19/17   [provider]  SUMAtriptan (IMITREX) 6 MG/0.5ML SOLN injection Inject 6 mg into the skin every 2 (two) hours as needed for migraine or headache. May repeat in 2 hours if headache persists or recurs.    [provider]  topiramate (TOPAMAX) 100 MG tablet Take 100 mg by mouth 2 (two) times daily.    [provider]  traMADol (ULTRAM) 50 MG tablet Take 1 tablet (50 mg total) by mouth every 8 (eight) hours as needed. 07/15/18   Coral Spikes, DO  triamterene-hydrochlorothiazide (MAXZIDE-25) 37.5-25 MG tablet Take 1 tablet by mouth daily. 07/01/19   [provider]  vitamin B-12 (CYANOCOBALAMIN) 1000 MCG tablet Take 2,000 mcg by mouth daily.    [provider]  Vitamin D, Ergocalciferol, (DRISDOL) 1.25 MG (50000 UT) CAPS capsule Take 50,000 Units by mouth every 7 (seven) days.  02/16/18   [provider]  zolpidem (AMBIEN) 5 MG tablet Take 5 mg by mouth at bedtime as needed for sleep.    [provider]    Family History Family History  Problem Relation Age of Onset   Kidney failure Mother    Diabetes Mother    Hyperlipidemia Mother    COPD Mother    Diabetes Father     Social History Social History   Tobacco Use   Smoking status: Former    Types: Cigarettes    Quit date: 12/31/2014    Years since quitting: 5.9   Smokeless tobacco: Never  Vaping Use   Vaping Use: Never used  Substance Use Topics   Alcohol  use: No   Drug use: No     Allergies   Penicillins, Aspirin, and Morphine and related   Review of Systems Review of Systems  Constitutional:  Positive for fatigue. Negative for chills and fever.  HENT:  Positive for congestion and rhinorrhea. Negative for ear pain and sore throat.   Respiratory:  Positive for cough. Negative for shortness of breath.   Cardiovascular:  Negative for chest pain and palpitations.  Gastrointestinal:  Positive for nausea. Negative for abdominal pain, diarrhea and vomiting.  Skin:  Negative for color change, rash and wound.  All other systems reviewed and are negative.   Physical Exam Triage Vital Signs ED Triage Vitals  Enc Vitals Group     BP      Pulse  Resp      Temp      Temp src      SpO2      Weight      Height      Head Circumference      Peak Flow      Pain Score      Pain Loc      Pain Edu?      Excl. in [place]?    No data found.  Updated Vital Signs BP 108/69 (BP Location: Left Arm)   Pulse 71   Temp 98.2 F (36.8 C) (Oral)   Resp 18   SpO2 98%   Visual Acuity Right Eye Distance:   Left Eye Distance:   Bilateral Distance:    Right Eye Near:   Left Eye Near:    Bilateral Near:     Physical Exam Vitals and nursing note reviewed.  Constitutional:      General: She is not in acute distress.    Appearance: She is well-developed. She is obese. She is not ill-appearing.  HENT:     Head: Normocephalic and atraumatic.     Right Ear: Tympanic membrane normal.     Left Ear: Tympanic membrane normal.     Nose: Nose normal.     Mouth/Throat:     Mouth: Mucous membranes are moist.     Pharynx: Oropharynx is clear.  Cardiovascular:     Rate and Rhythm: Normal rate and regular rhythm.     Heart sounds: Normal heart sounds.  Pulmonary:     Effort: Pulmonary effort is normal. No respiratory distress.     Breath sounds: Normal breath sounds.  Abdominal:     General: Bowel sounds are normal.     Palpations: Abdomen is  soft.     Tenderness: There is no abdominal tenderness. There is no guarding or rebound.  Musculoskeletal:     Cervical back: Neck supple.  Skin:    General: Skin is warm and dry.     Findings: No bruising, erythema, lesion or rash.  Neurological:     Mental Status: She is alert and oriented to person, place, and time.  Psychiatric:        Mood and Affect: Mood normal.        Behavior: Behavior normal.     UC Treatments / Results  Labs (all labs ordered are listed, but only abnormal results are displayed) Labs Reviewed  COVID-19, FLU A+B NAA    EKG   Radiology No results found.  Procedures Procedures (including critical care time)  Medications Ordered in UC Medications - No data to display  Initial Impression / Assessment and Plan / UC Course  I have reviewed the triage vital signs and the nursing notes.  Pertinent labs & imaging results that were available during my care of the patient were reviewed by me and considered in my medical decision making (see chart for details).  Viral illness, lower abdominal pain.  Abdomen is soft and nontender.  COVID and flu pending.  Instructed patient to self quarantine per CDC guidelines.  Discussed symptomatic treatment including Tylenol or ibuprofen, rest, hydration.  Instructed patient to follow up with PCP tomorrow.  ED precautions discussed.  Patient agrees to plan of care.    Final Clinical Impressions(s) / UC Diagnoses   Final diagnoses:  Viral illness  Lower abdominal pain     Discharge Instructions      Your COVID and Flu tests are pending. Take Tylenol as  needed for fever or discomfort.  Rest and keep yourself hydrated.  Follow-up with your primary care provider tomorrow.  Go to the emergency department if you have acute abdominal pain or other concerning symptoms.         ED Prescriptions   None    PDMP not reviewed this encounter.   Sharion Balloon, NP 12/05/20 1825

## 2020-12-05 NOTE — Discharge Instructions (Addendum)
Your COVID and Flu tests are pending. Take Tylenol as needed for fever or discomfort.  Rest and keep yourself hydrated.  Follow-up with your primary care provider tomorrow.  Go to the emergency department if you have acute abdominal pain or other concerning symptoms.

## 2020-12-05 NOTE — ED Triage Notes (Signed)
Pt c/o cough, runny nose and congestion x 4 days.

## 2020-12-06 LAB — COVID-19, FLU A+B NAA
Influenza A, NAA: NOT DETECTED
Influenza B, NAA: NOT DETECTED
SARS-CoV-2, NAA: NOT DETECTED

## 2021-02-21 ENCOUNTER — Other Ambulatory Visit: Payer: Self-pay

## 2021-02-21 ENCOUNTER — Encounter: Payer: Self-pay | Admitting: Emergency Medicine

## 2021-02-21 ENCOUNTER — Ambulatory Visit
Admission: EM | Admit: 2021-02-21 | Discharge: 2021-02-21 | Disposition: A | Discharge status: Home / Self Care | Payer: Managed Care, Other (non HMO) | Attending: Emergency Medicine | Admitting: Emergency Medicine

## 2021-02-21 DIAGNOSIS — B349 Viral infection, unspecified: Secondary | ICD-10-CM

## 2021-02-21 LAB — POCT RAPID STREP A (OFFICE): Rapid Strep A Screen: NEGATIVE

## 2021-02-21 MED ORDER — LIDOCAINE VISCOUS HCL 2 % MT SOLN: 15.0000 mL | OROMUCOSAL | 0 refills | Status: AC | PRN

## 2021-02-21 NOTE — ED Provider Notes (Signed)
Roderic Palau    CSN: [id number] Arrival date & time: 02/21/21  3299      History   Chief Complaint Chief Complaint  Patient presents with   Sore Throat    HPI Marisa Lewis is a 56 y.o. female.  Patient presents with sore throat, postnasal drip, mild cough x1 day.  She denies fever, chills, rash, arthralgias, chest pain, shortness of breath, wheezing, vomiting, diarrhea, or other symptoms.  Treatment at home with Mucinex.  Her medical history includes asthma, morbid obesity, GERD, migraine headaches, chronic pain syndrome.  The history is provided by the patient and medical records.   Past Medical History:  Diagnosis Date   Asthma    rare   GERD (gastroesophageal reflux disease)    Migraine    approx 1x/wk   Sciatica of left side     Patient Active Problem List   Diagnosis Date Noted   Spondylosis without myelopathy or radiculopathy, lumbosacral region 09/21/2019   Elevated vitamin B12 level 07/28/2019   Chronic pain syndrome 06/28/2019   Pharmacologic therapy 06/28/2019   Disorder of skeletal system 06/28/2019   Problems influencing health status 06/28/2019   Chronic lower extremity pain (1ry area of Pain) (Left) 06/28/2019   Chronic low back pain (2ry area of Pain) (Left) w/o sciatica 06/28/2019   Lumbar facet joint syndrome (Bilateral) (L>R) 06/28/2019   Chronic sacroiliac joint pain (Right) 06/28/2019   DDD (degenerative disc disease), lumbar 06/28/2019   Abnormal MRI, lumbar spine (05/21/2019) 06/28/2019   Lumbar central spinal stenosis, 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, L5-S1) 06/28/2019   Lumbosacral lateral recess stenosis (L5-S1) (Right) 06/28/2019   Lumbosacral facet arthropathy (L4-5) (R>L) 06/28/2019   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/28/2019   Abdominal pain 06/27/2019   Gastroesophageal reflux disease 06/27/2019   Left lower quadrant pain 06/27/2019   Morbid obesity ([place]) 06/27/2019   Rectal bleeding    Polyp of sigmoid colon    Internal  hemorrhoids    Morbid obesity with BMI of 40.0-44.9, adult ([place]) 03/12/2018    Past Surgical History:  Procedure Laterality Date   ABDOMINAL HYSTERECTOMY     COLONOSCOPY WITH PROPOFOL N/A 06/16/2018   Procedure: COLONOSCOPY WITH BIOPSIES;  Surgeon: Virgel Manifold, MD;  Location: [place];  Service: Endoscopy;  Laterality: N/A;   GASTRIC BYPASS  1997 and 2005   POLYPECTOMY N/A 06/16/2018   Procedure: POLYPECTOMY;  Surgeon: Virgel Manifold, MD;  Location: [facility];  Service: Endoscopy;  Laterality: N/A;   SPLENECTOMY, TOTAL     UMBILICAL HERNIA REPAIR      OB History   No obstetric history on file.      Home Medications    Prior to Admission medications   Medication Sig Start Date End Date Taking? Authorizing Provider  lidocaine (XYLOCAINE) 2 % solution Use as directed 15 mLs in the mouth or throat as needed for mouth pain. 02/21/21  Yes Sharion Balloon, NP  albuterol (PROVENTIL HFA;VENTOLIN HFA) 108 (90 Base) MCG/ACT inhaler Inhale into the lungs every 6 (six) hours as needed for wheezing or shortness of breath.    [provider]  cetirizine (ZYRTEC) 10 MG tablet Take 10 mg by mouth daily.    [provider]  citalopram (CELEXA) 40 MG tablet Take 40 mg by mouth daily. 07/05/19   [provider]  cyclobenzaprine (FLEXERIL) 10 MG tablet Take 10 mg by mouth 3 (three) times daily as needed for muscle spasms.    [provider]  eletriptan (  RELPAX) 40 MG tablet Take 40 mg by mouth as needed for migraine or headache. May repeat in 2 hours if headache persists or recurs.    [provider]  ferrous sulfate 325 (65 FE) MG tablet Take 325 mg by mouth daily with breakfast.    [provider]  flavoxATE (URISPAS) 100 MG tablet Take 100 mg by mouth as needed.    [provider]  fluticasone (FLONASE) 50 MCG/ACT nasal spray Place 2 sprays into both nostrils daily.    [provider]  gabapentin  (NEURONTIN) 300 MG capsule Take 300 mg by mouth 3 (three) times daily.    [provider]  Iron-Vitamin C (VITRON-C) 65-125 MG TABS Take 1 tablet by mouth daily. 06/07/16   [provider]  naproxen (NAPROSYN) 500 MG tablet Take 1 tablet (500 mg total) by mouth 2 (two) times daily. 07/30/19   Luvenia Redden, PA-C  omeprazole (PRILOSEC) 40 MG capsule Take 40 mg by mouth daily.    [provider]  Promethazine HCl (PHENERGAN PO) Take 10 mg by mouth.    [provider]  SAXENDA 18 MG/3ML SOPN  09/19/17   [provider]  SUMAtriptan (IMITREX) 6 MG/0.5ML SOLN injection Inject 6 mg into the skin every 2 (two) hours as needed for migraine or headache. May repeat in 2 hours if headache persists or recurs.    [provider]  topiramate (TOPAMAX) 100 MG tablet Take 100 mg by mouth 2 (two) times daily.    [provider]  traMADol (ULTRAM) 50 MG tablet Take 1 tablet (50 mg total) by mouth every 8 (eight) hours as needed. 07/15/18   Coral Spikes, DO  triamterene-hydrochlorothiazide (MAXZIDE-25) 37.5-25 MG tablet Take 1 tablet by mouth daily. 07/01/19   [provider]  vitamin B-12 (CYANOCOBALAMIN) 1000 MCG tablet Take 2,000 mcg by mouth daily.    [provider]  Vitamin D, Ergocalciferol, (DRISDOL) 1.25 MG (50000 UT) CAPS capsule Take 50,000 Units by mouth every 7 (seven) days.  02/16/18   [provider]  zolpidem (AMBIEN) 5 MG tablet Take 5 mg by mouth at bedtime as needed for sleep.    [provider]    Family History Family History  Problem Relation Age of Onset   Kidney failure Mother    Diabetes Mother    Hyperlipidemia Mother    COPD Mother    Diabetes Father     Social History Social History   Tobacco Use   Smoking status: Former    Types: Cigarettes    Quit date: 12/31/2014    Years since quitting: 6.1   Smokeless tobacco: Never  Vaping Use   Vaping Use: Never used  Substance Use  Topics   Alcohol use: No   Drug use: No     Allergies   Penicillins, Aspirin, and Morphine and related   Review of Systems Review of Systems  Constitutional:  Negative for chills and fever.  HENT:  Positive for postnasal drip and sore throat. Negative for ear pain.   Respiratory:  Positive for cough. Negative for shortness of breath.   Cardiovascular:  Negative for chest pain and palpitations.  Gastrointestinal:  Negative for diarrhea and vomiting.  Musculoskeletal:  Negative for arthralgias and joint swelling.  Skin:  Negative for color change and rash.  All other systems reviewed and are negative.   Physical Exam Triage Vital Signs ED Triage Vitals  Enc Vitals Group     BP  Pulse      Resp      Temp      Temp src      SpO2      Weight      Height      Head Circumference      Peak Flow      Pain Score      Pain Loc      Pain Edu?      Excl. in [place]?    No data found.  Updated Vital Signs BP 105/71 (BP Location: Left Wrist)    Pulse 73    Temp 98.3 F (36.8 C)    Resp 16    SpO2 97%   Visual Acuity Right Eye Distance:   Left Eye Distance:   Bilateral Distance:    Right Eye Near:   Left Eye Near:    Bilateral Near:     Physical Exam Vitals and nursing note reviewed.  Constitutional:      General: She is not in acute distress.    Appearance: She is well-developed. She is obese. She is not ill-appearing.  HENT:     Right Ear: Tympanic membrane normal.     Left Ear: Tympanic membrane normal.     Nose: Nose normal.     Mouth/Throat:     Mouth: Mucous membranes are moist.     Pharynx: Oropharynx is clear.  Cardiovascular:     Rate and Rhythm: Normal rate and regular rhythm.     Heart sounds: Normal heart sounds.  Pulmonary:     Effort: Pulmonary effort is normal. No respiratory distress.     Breath sounds: Normal breath sounds.  Musculoskeletal:     Cervical back: Neck supple.  Skin:    General: Skin is warm and dry.  Neurological:     Mental  Status: She is alert.  Psychiatric:        Mood and Affect: Mood normal.        Behavior: Behavior normal.     UC Treatments / Results  Labs (all labs ordered are listed, but only abnormal results are displayed) Labs Reviewed  COVID-19, FLU A+B NAA  POCT RAPID STREP A (OFFICE)    EKG   Radiology No results found.  Procedures Procedures (including critical care time)  Medications Ordered in UC Medications - No data to display  Initial Impression / Assessment and Plan / UC Course  I have reviewed the triage vital signs and the nursing notes.  Pertinent labs & imaging results that were available during my care of the patient were reviewed by me and considered in my medical decision making (see chart for details).   Viral illness.  Rapid strep negative.  COVID and Flu pending.  Instructed patient to self quarantine per CDC guidelines.  Treating sore throat with viscous lidocaine.  Discussed other symptomatic treatment including Tylenol or ibuprofen, rest, hydration.  Instructed patient to follow up with PCP if symptoms are not improving.  Patient agrees to plan of care.    Final Clinical Impressions(s) / UC Diagnoses   Final diagnoses:  Viral illness     Discharge Instructions      Your strep test is negative.  Your COVID and Flu tests are pending.  You should self quarantine until the test results are back.    Use the viscous lidocaine as directed.  Take Tylenol or ibuprofen as needed for fever or discomfort.  Rest and keep yourself hydrated.    Follow-up with your  primary care provider if your symptoms are not improving.         ED Prescriptions     Medication Sig Dispense Auth. Provider   lidocaine (XYLOCAINE) 2 % solution Use as directed 15 mLs in the mouth or throat as needed for mouth pain. 100 mL Sharion Balloon, NP      PDMP not reviewed this encounter.   Sharion Balloon, NP 02/21/21 [phone]

## 2021-02-21 NOTE — ED Triage Notes (Signed)
Pt presents with ST that's started last night.

## 2021-02-21 NOTE — Discharge Instructions (Addendum)
Your strep test is negative.  Your COVID and Flu tests are pending.  You should self quarantine until the test results are back.    Use the viscous lidocaine as directed.  Take Tylenol or ibuprofen as needed for fever or discomfort.  Rest and keep yourself hydrated.    Follow-up with your primary care provider if your symptoms are not improving.

## 2021-02-22 LAB — COVID-19, FLU A+B NAA
Influenza A, NAA: NOT DETECTED
Influenza B, NAA: NOT DETECTED
SARS-CoV-2, NAA: NOT DETECTED

## 2021-02-24 ENCOUNTER — Encounter: Payer: Self-pay | Admitting: Emergency Medicine

## 2021-02-24 ENCOUNTER — Other Ambulatory Visit: Payer: Self-pay

## 2021-02-24 ENCOUNTER — Ambulatory Visit
Admission: EM | Admit: 2021-02-24 | Discharge: 2021-02-24 | Disposition: A | Discharge status: Home / Self Care | Payer: Managed Care, Other (non HMO) | Attending: Emergency Medicine | Admitting: Emergency Medicine

## 2021-02-24 DIAGNOSIS — J0100 Acute maxillary sinusitis, unspecified: Secondary | ICD-10-CM | POA: Diagnosis not present

## 2021-02-24 MED ORDER — PROMETHAZINE-DM 6.25-15 MG/5ML PO SYRP: 5.0000 mL | ORAL_SOLUTION | Freq: Four times a day (QID) | ORAL | 0 refills | Status: DC | PRN

## 2021-02-24 MED ORDER — DOXYCYCLINE HYCLATE 100 MG PO CAPS: 100.0000 mg | ORAL_CAPSULE | Freq: Two times a day (BID) | ORAL | 0 refills | Status: DC

## 2021-02-24 MED ORDER — BENZONATATE 100 MG PO CAPS: 100.0000 mg | ORAL_CAPSULE | Freq: Three times a day (TID) | ORAL | 0 refills | Status: AC

## 2021-02-24 NOTE — ED Triage Notes (Signed)
Patient c/o cough, congestion, right ear pain and right sinus/facial pain for a week.  Patient denies fevers.

## 2021-02-24 NOTE — Discharge Instructions (Signed)
The Doxycycline twice daily with food for 10 days for treatment of your sinusitis.  Perform sinus irrigation 2-3 times a day with a [employer] sinus rinse kit and distilled water.  Do not use tap water.  You can use plain over-the-counter Mucinex every 6 hours to break up the stickiness of the mucus so your body can clear it.  Increase your oral fluid intake to thin out your mucus so that is also able for your body to clear more easily.   Use the Atrovent nasal spray, 2 squirts up each nostril every 6 hours, as needed for nasal and sinus congestion.  Use the Tessalon Perles during the day as needed for cough.  Take 1 tablet every 8 hours with a small sip of water as needed for cough.  You may experience some numbness to the base of your tongue or metallic taste in her mouth, this is normal.  Use the Promethazine DM cough syrup at bedtime as needed for cough and congestion.  If you develop any new or worsening symptoms return for reevaluation or see your primary care provider.

## 2021-02-24 NOTE — ED Provider Notes (Signed)
[facility]    CSN: [id number] Arrival date & time: 02/24/21  1159      History   Chief Complaint Chief Complaint  Patient presents with   Otalgia   Cough    HPI Marisa Lewis is a 56 y.o. female.   Patient presents with chills, bilateral ear fullness, sinus pain and pressure, sore throat, nonproductive coughing for 7 days.  Tolerating food and liquids.  Known sick contacts in household.  Has attempted use of lidocaine, Mucinex which has not been helpful.  Was seen in urgent care a few days ago, diagnosed with viral illness.  Endorses that symptoms have worsened.  History of asthma, GERD, migraine.   Past Medical History:  Diagnosis Date   Asthma    rare   GERD (gastroesophageal reflux disease)    Migraine    approx 1x/wk   Sciatica of left side     Patient Active Problem List   Diagnosis Date Noted   Spondylosis without myelopathy or radiculopathy, lumbosacral region 09/21/2019   Elevated vitamin B12 level 07/28/2019   Chronic pain syndrome 06/28/2019   Pharmacologic therapy 06/28/2019   Disorder of skeletal system 06/28/2019   Problems influencing health status 06/28/2019   Chronic lower extremity pain (1ry area of Pain) (Left) 06/28/2019   Chronic low back pain (2ry area of Pain) (Left) w/o sciatica 06/28/2019   Lumbar facet joint syndrome (Bilateral) (L>R) 06/28/2019   Chronic sacroiliac joint pain (Right) 06/28/2019   DDD (degenerative disc disease), lumbar 06/28/2019   Abnormal MRI, lumbar spine (05/21/2019) 06/28/2019   Lumbar central spinal stenosis, 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, L5-S1) 06/28/2019   Lumbosacral lateral recess stenosis (L5-S1) (Right) 06/28/2019   Lumbosacral facet arthropathy (L4-5) (R>L) 06/28/2019   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/28/2019   Abdominal pain 06/27/2019   Gastroesophageal reflux disease 06/27/2019   Left lower quadrant pain 06/27/2019   Morbid obesity ([place]) 06/27/2019   Rectal bleeding    Polyp of sigmoid  colon    Internal hemorrhoids    Morbid obesity with BMI of 40.0-44.9, adult ([place]) 03/12/2018    Past Surgical History:  Procedure Laterality Date   ABDOMINAL HYSTERECTOMY     COLONOSCOPY WITH PROPOFOL N/A 06/16/2018   Procedure: COLONOSCOPY WITH BIOPSIES;  Surgeon: Virgel Manifold, MD;  Location: [place];  Service: Endoscopy;  Laterality: N/A;   GASTRIC BYPASS  1997 and 2005   POLYPECTOMY N/A 06/16/2018   Procedure: POLYPECTOMY;  Surgeon: Virgel Manifold, MD;  Location: [place];  Service: Endoscopy;  Laterality: N/A;   SPLENECTOMY, TOTAL     UMBILICAL HERNIA REPAIR      OB History   No obstetric history on file.      Home Medications    Prior to Admission medications   Medication Sig Start Date End Date Taking? Authorizing Provider  albuterol (PROVENTIL HFA;VENTOLIN HFA) 108 (90 Base) MCG/ACT inhaler Inhale into the lungs every 6 (six) hours as needed for wheezing or shortness of breath.    [provider]  cetirizine (ZYRTEC) 10 MG tablet Take 10 mg by mouth daily.    [provider]  citalopram (CELEXA) 40 MG tablet Take 40 mg by mouth daily. 07/05/19   [provider]  cyclobenzaprine (FLEXERIL) 10 MG tablet Take 10 mg by mouth 3 (three) times daily as needed for muscle spasms.    [provider]  eletriptan (RELPAX) 40 MG tablet Take 40 mg by mouth as needed for migraine or headache. May repeat in  2 hours if headache persists or recurs.    [provider]  ferrous sulfate 325 (65 FE) MG tablet Take 325 mg by mouth daily with breakfast.    [provider]  flavoxATE (URISPAS) 100 MG tablet Take 100 mg by mouth as needed.    [provider]  fluticasone (FLONASE) 50 MCG/ACT nasal spray Place 2 sprays into both nostrils daily.    [provider]  gabapentin (NEURONTIN) 300 MG capsule Take 300 mg by mouth 3 (three) times daily.    [provider]  Iron-Vitamin C  (VITRON-C) 65-125 MG TABS Take 1 tablet by mouth daily. 06/07/16   [provider]  lidocaine (XYLOCAINE) 2 % solution Use as directed 15 mLs in the mouth or throat as needed for mouth pain. 02/21/21   Sharion Balloon, NP  naproxen (NAPROSYN) 500 MG tablet Take 1 tablet (500 mg total) by mouth 2 (two) times daily. 07/30/19   Luvenia Redden, PA-C  omeprazole (PRILOSEC) 40 MG capsule Take 40 mg by mouth daily.    [provider]  Promethazine HCl (PHENERGAN PO) Take 10 mg by mouth.    [provider]  SAXENDA 18 MG/3ML SOPN  09/19/17   [provider]  SUMAtriptan (IMITREX) 6 MG/0.5ML SOLN injection Inject 6 mg into the skin every 2 (two) hours as needed for migraine or headache. May repeat in 2 hours if headache persists or recurs.    [provider]  topiramate (TOPAMAX) 100 MG tablet Take 100 mg by mouth 2 (two) times daily.    [provider]  traMADol (ULTRAM) 50 MG tablet Take 1 tablet (50 mg total) by mouth every 8 (eight) hours as needed. 07/15/18   Coral Spikes, DO  triamterene-hydrochlorothiazide (MAXZIDE-25) 37.5-25 MG tablet Take 1 tablet by mouth daily. 07/01/19   [provider]  vitamin B-12 (CYANOCOBALAMIN) 1000 MCG tablet Take 2,000 mcg by mouth daily.    [provider]  Vitamin D, Ergocalciferol, (DRISDOL) 1.25 MG (50000 UT) CAPS capsule Take 50,000 Units by mouth every 7 (seven) days.  02/16/18   [provider]  zolpidem (AMBIEN) 5 MG tablet Take 5 mg by mouth at bedtime as needed for sleep.    [provider]    Family History Family History  Problem Relation Age of Onset   Kidney failure Mother    Diabetes Mother    Hyperlipidemia Mother    COPD Mother    Diabetes Father     Social History Social History   Tobacco Use   Smoking status: Former    Types: Cigarettes    Quit date: 12/31/2014    Years since quitting: 6.1   Smokeless tobacco: Never  Vaping Use   Vaping Use: Never used   Substance Use Topics   Alcohol use: No   Drug use: No     Allergies   Penicillins, Aspirin, and Morphine and related   Review of Systems Review of Systems  Constitutional:  Positive for chills. Negative for activity change, appetite change, diaphoresis, fatigue, fever and unexpected weight change.  HENT:  Positive for ear pain and sore throat. Negative for congestion, dental problem, drooling, ear discharge, facial swelling, hearing loss, mouth sores, nosebleeds, postnasal drip, rhinorrhea, sinus pressure, sinus pain, sneezing, tinnitus, trouble swallowing and voice change.   Respiratory:  Positive for cough. Negative for apnea, choking, chest tightness, shortness of breath, wheezing and stridor.   Cardiovascular: Negative.   Gastrointestinal: Negative.   Skin: Negative.  Neurological: Negative.     Physical Exam Triage Vital Signs ED Triage Vitals  Enc Vitals Group     BP 02/24/21 1243 99/68     Pulse Rate 02/24/21 1243 61     Resp 02/24/21 1243 14     Temp 02/24/21 1243 98.1 F (36.7 C)     Temp Source 02/24/21 1243 Oral     SpO2 02/24/21 1243 99 %     Weight 02/24/21 1241 228 lb (103.4 kg)     Height 02/24/21 1241 5\' 1"  (1.549 m)     Head Circumference --      Peak Flow --      Pain Score 02/24/21 1240 3     Pain Loc --      Pain Edu? --      Excl. in [place]? --    No data found.  Updated Vital Signs BP 99/68 (BP Location: Right Arm)    Pulse 61    Temp 98.1 F (36.7 C) (Oral)    Resp 14    Ht 5\' 1"  (1.549 m)    Wt 228 lb (103.4 kg)    SpO2 99%    BMI 43.08 kg/m   Visual Acuity Right Eye Distance:   Left Eye Distance:   Bilateral Distance:    Right Eye Near:   Left Eye Near:    Bilateral Near:     Physical Exam Constitutional:      Appearance: Normal appearance.  HENT:     Head: Normocephalic.     Right Ear: Tympanic membrane, ear canal and external ear normal.     Left Ear: Tympanic membrane, ear canal and external ear normal.     Nose: Congestion  and rhinorrhea present.     Right Sinus: Maxillary sinus tenderness present.     Left Sinus: Maxillary sinus tenderness present.     Mouth/Throat:     Mouth: Mucous membranes are moist.     Pharynx: Oropharynx is clear.  Eyes:     Extraocular Movements: Extraocular movements intact.  Cardiovascular:     Rate and Rhythm: Normal rate and regular rhythm.     Pulses: Normal pulses.     Heart sounds: Normal heart sounds.  Pulmonary:     Effort: Pulmonary effort is normal.     Breath sounds: Normal breath sounds.  Musculoskeletal:     Cervical back: Normal range of motion and neck supple.  Skin:    General: Skin is warm and dry.  Neurological:     Mental Status: She is alert and oriented to person, place, and time. Mental status is at baseline.  Psychiatric:        Mood and Affect: Mood normal.        Behavior: Behavior normal.     UC Treatments / Results  Labs (all labs ordered are listed, but only abnormal results are displayed) Labs Reviewed - No data to display  EKG   Radiology No results found.  Procedures Procedures (including critical care time)  Medications Ordered in UC Medications - No data to display  Initial Impression / Assessment and Plan / UC Course  I have reviewed the triage vital signs and the nursing notes.  Pertinent labs & imaging results that were available during my care of the patient were reviewed by me and considered in my medical decision making (see chart for details).  Acute nonrecurrent maxillary sinusitis  Vital signs are stable, no signs of distress, lungs clear to auscultation, will move  forward with treatment for sinus infection, doxycycline 7-day course prescribed as allergy to penicillin, Tessalon and Promethazine DM prescribed as well for management of coughing, recommended antihistamines, over-the-counter Mucinex, saline irrigation, warm teas, honey for additional comfort, may follow-up with urgent care or primary care doctor as  needed Final Clinical Impressions(s) / UC Diagnoses   Final diagnoses:  None   Discharge Instructions   None    ED Prescriptions   None    PDMP not reviewed this encounter.   Hans Eden, NP 02/24/21 1510

## 2021-06-04 ENCOUNTER — Encounter: Payer: Self-pay | Admitting: Emergency Medicine

## 2021-06-04 ENCOUNTER — Other Ambulatory Visit: Payer: Self-pay

## 2021-06-04 ENCOUNTER — Ambulatory Visit
Admission: EM | Admit: 2021-06-04 | Discharge: 2021-06-04 | Disposition: A | Discharge status: Home / Self Care | Payer: Managed Care, Other (non HMO) | Attending: Student | Admitting: Student

## 2021-06-04 DIAGNOSIS — J4521 Mild intermittent asthma with (acute) exacerbation: Secondary | ICD-10-CM

## 2021-06-04 DIAGNOSIS — Z760 Encounter for issue of repeat prescription: Secondary | ICD-10-CM

## 2021-06-04 DIAGNOSIS — J329 Chronic sinusitis, unspecified: Secondary | ICD-10-CM

## 2021-06-04 DIAGNOSIS — J40 Bronchitis, not specified as acute or chronic: Secondary | ICD-10-CM | POA: Diagnosis not present

## 2021-06-04 DIAGNOSIS — Z880 Allergy status to penicillin: Secondary | ICD-10-CM

## 2021-06-04 MED ORDER — DOXYCYCLINE HYCLATE 100 MG PO CAPS: 100.0000 mg | ORAL_CAPSULE | Freq: Two times a day (BID) | ORAL | 0 refills | Status: AC

## 2021-06-04 MED ORDER — ALBUTEROL SULFATE HFA 108 (90 BASE) MCG/ACT IN AERS: 1.0000 | INHALATION_SPRAY | Freq: Four times a day (QID) | RESPIRATORY_TRACT | 0 refills | Status: AC | PRN

## 2021-06-04 NOTE — Discharge Instructions (Addendum)
-  Start the antibiotic, doxycycline twice daily for 7 days.  Make sure to wear sunscreen while spending periods of time outside on this medication as it can increase your chance of sunburn. -Albuterol inhaler as needed for cough, wheezing, shortness of breath, 1 to 2 puffs every 6 hours as needed. -Follow-up if symptoms worsen instead of improve - sore throat, facial pain, new shortness of breath, new chest pain, coughing up red or dark sputum, etc.

## 2021-06-04 NOTE — ED Provider Notes (Signed)
[facility]    CSN: [id number] Arrival date & time: 06/04/21  1033      History   Chief Complaint Chief Complaint  Patient presents with   Sore Throat    Cough, loss of voice, tired - Entered by patient   Cough    HPI Marisa Lewis is a 56 y.o. female presenting with 1 week of progressively worsening nasal congestion, postnasal drip, cough.  History allergic rhinitis that is currently controlled on daily Zyrtec only.  States her congestion is getting thicker and more purulent with drainage down the throat and hoarse voice.  Cough is occasionally productive of yellow sputum.  There is no associated shortness of breath, chest pain, known fevers, dizziness, weakness.  Notes that her symptoms began after she did some yard work outside.  HPI  Past Medical History:  Diagnosis Date   Asthma    rare   GERD (gastroesophageal reflux disease)    Migraine    approx 1x/wk   Sciatica of left side     Patient Active Problem List   Diagnosis Date Noted   Spondylosis without myelopathy or radiculopathy, lumbosacral region 09/21/2019   Elevated vitamin B12 level 07/28/2019   Chronic pain syndrome 06/28/2019   Pharmacologic therapy 06/28/2019   Disorder of skeletal system 06/28/2019   Problems influencing health status 06/28/2019   Chronic lower extremity pain (1ry area of Pain) (Left) 06/28/2019   Chronic low back pain (2ry area of Pain) (Left) w/o sciatica 06/28/2019   Lumbar facet joint syndrome (Bilateral) (L>R) 06/28/2019   Chronic sacroiliac joint pain (Right) 06/28/2019   DDD (degenerative disc disease), lumbar 06/28/2019   Abnormal MRI, lumbar spine (05/21/2019) 06/28/2019   Lumbar central spinal stenosis, 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, L5-S1) 06/28/2019   Lumbosacral lateral recess stenosis (L5-S1) (Right) 06/28/2019   Lumbosacral facet arthropathy (L4-5) (R>L) 06/28/2019   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/28/2019   Abdominal pain 06/27/2019   Gastroesophageal  reflux disease 06/27/2019   Left lower quadrant pain 06/27/2019   Morbid obesity ([place]) 06/27/2019   Rectal bleeding    Polyp of sigmoid colon    Internal hemorrhoids    Morbid obesity with BMI of 40.0-44.9, adult ([place]) 03/12/2018    Past Surgical History:  Procedure Laterality Date   ABDOMINAL HYSTERECTOMY     COLONOSCOPY WITH PROPOFOL N/A 06/16/2018   Procedure: COLONOSCOPY WITH BIOPSIES;  Surgeon: Virgel Manifold, MD;  Location: [place];  Service: Endoscopy;  Laterality: N/A;   GASTRIC BYPASS  1997 and 2005   POLYPECTOMY N/A 06/16/2018   Procedure: POLYPECTOMY;  Surgeon: Virgel Manifold, MD;  Location: [place];  Service: Endoscopy;  Laterality: N/A;   SPLENECTOMY, TOTAL     UMBILICAL HERNIA REPAIR      OB History   No obstetric history on file.      Home Medications    Prior to Admission medications   Medication Sig Start Date End Date Taking? Authorizing Provider  doxycycline (VIBRAMYCIN) 100 MG capsule Take 1 capsule (100 mg total) by mouth 2 (two) times daily for 7 days. 06/04/21 06/11/21 Yes Hazel Sams, PA-C  albuterol (VENTOLIN HFA) 108 (90 Base) MCG/ACT inhaler Inhale 1-2 puffs into the lungs every 6 (six) hours as needed for wheezing or shortness of breath. 06/04/21   Hazel Sams, PA-C  benzonatate (TESSALON) 100 MG capsule Take 1 capsule (100 mg total) by mouth every 8 (eight) hours. Patient not taking: Reported on 06/04/2021 02/24/21   Hans Eden,  NP  cetirizine (ZYRTEC) 10 MG tablet Take 10 mg by mouth daily.    [provider]  citalopram (CELEXA) 40 MG tablet Take 40 mg by mouth daily. 07/05/19   [provider]  cyclobenzaprine (FLEXERIL) 10 MG tablet Take 10 mg by mouth 3 (three) times daily as needed for muscle spasms.    [provider]  eletriptan (RELPAX) 40 MG tablet Take 40 mg by mouth as needed for migraine or headache. May repeat in 2 hours if headache persists or recurs.    [provider]  ferrous sulfate 325 (65 FE) MG tablet Take 325 mg by mouth daily with breakfast.    [provider]  flavoxATE (URISPAS) 100 MG tablet Take 100 mg by mouth as needed.    [provider]  fluticasone (FLONASE) 50 MCG/ACT nasal spray Place 2 sprays into both nostrils daily.    [provider]  gabapentin (NEURONTIN) 300 MG capsule Take 300 mg by mouth 3 (three) times daily.    [provider]  Iron-Vitamin C (VITRON-C) 65-125 MG TABS Take 1 tablet by mouth daily. 06/07/16   [provider]  lidocaine (XYLOCAINE) 2 % solution Use as directed 15 mLs in the mouth or throat as needed for mouth pain. 02/21/21   Sharion Balloon, NP  naproxen (NAPROSYN) 500 MG tablet Take 1 tablet (500 mg total) by mouth 2 (two) times daily. 07/30/19   Luvenia Redden, PA-C  omeprazole (PRILOSEC) 40 MG capsule Take 40 mg by mouth daily.    [provider]  Promethazine HCl (PHENERGAN PO) Take 10 mg by mouth.    [provider]  promethazine-dextromethorphan (PROMETHAZINE-DM) 6.25-15 MG/5ML syrup Take 5 mLs by mouth 4 (four) times daily as needed for cough. Patient not taking: Reported on 06/04/2021 02/24/21   Hans Eden, NP  SAXENDA 18 MG/3ML [facility]  09/19/17   [provider]  SUMAtriptan (IMITREX) 6 MG/0.5ML SOLN injection Inject 6 mg into the skin every 2 (two) hours as needed for migraine or headache. May repeat in 2 hours if headache persists or recurs.    [provider]  topiramate (TOPAMAX) 100 MG tablet Take 100 mg by mouth 2 (two) times daily.    [provider]  traMADol (ULTRAM) 50 MG tablet Take 1 tablet (50 mg total) by mouth every 8 (eight) hours as needed. Patient not taking: Reported on 06/04/2021 07/15/18   Coral Spikes, DO  triamterene-hydrochlorothiazide (MAXZIDE-25) 37.5-25 MG tablet Take 1 tablet by mouth daily. 07/01/19   [provider]  vitamin B-12 (CYANOCOBALAMIN) 1000 MCG tablet Take  2,000 mcg by mouth daily.    [provider]  Vitamin D, Ergocalciferol, (DRISDOL) 1.25 MG (50000 UT) CAPS capsule Take 50,000 Units by mouth every 7 (seven) days.  02/16/18   [provider]  zolpidem (AMBIEN) 5 MG tablet Take 5 mg by mouth at bedtime as needed for sleep.    [provider]    Family History Family History  Problem Relation Age of Onset   Kidney failure Mother    Diabetes Mother    Hyperlipidemia Mother    COPD Mother    Diabetes Father     Social History Social History   Tobacco Use   Smoking status: Former    Types: Cigarettes    Quit date: 12/31/2014    Years since quitting: 6.4   Smokeless tobacco: Never  Vaping Use   Vaping Use: Never used  Substance Use  Topics   Alcohol use: No   Drug use: No     Allergies   Penicillins, Aspirin, and Morphine and related   Review of Systems Review of Systems  Constitutional:  Negative for appetite change, chills and fever.  HENT:  Positive for congestion and sinus pressure. Negative for ear pain, rhinorrhea, sinus pain and sore throat.   Eyes:  Negative for redness and visual disturbance.  Respiratory:  Positive for cough. Negative for chest tightness, shortness of breath and wheezing.   Cardiovascular:  Negative for chest pain and palpitations.  Gastrointestinal:  Negative for abdominal pain, constipation, diarrhea, nausea and vomiting.  Genitourinary:  Negative for dysuria, frequency and urgency.  Musculoskeletal:  Negative for myalgias.  Neurological:  Negative for dizziness, weakness and headaches.  Psychiatric/Behavioral:  Negative for confusion.   All other systems reviewed and are negative.   Physical Exam Triage Vital Signs ED Triage Vitals  Enc Vitals Group     BP 06/04/21 1217 111/67     Pulse Rate 06/04/21 1217 68     Resp 06/04/21 1217 (!) 22     Temp 06/04/21 1217 98.3 F (36.8 C)     Temp Source 06/04/21 1217 Oral     SpO2 06/04/21 1217 100 %     Weight --       Height --      Head Circumference --      Peak Flow --      Pain Score 06/04/21 1213 2     Pain Loc --      Pain Edu? --      Excl. in [place]? --    No data found.  Updated Vital Signs BP 111/67 (BP Location: Right Arm) Comment (BP Location): large cuff  Pulse 68   Temp 98.3 F (36.8 C) (Oral)   Resp (!) 22   SpO2 100%   Visual Acuity Right Eye Distance:   Left Eye Distance:   Bilateral Distance:    Right Eye Near:   Left Eye Near:    Bilateral Near:     Physical Exam Vitals reviewed.  Constitutional:      General: She is not in acute distress.    Appearance: Normal appearance. She is not ill-appearing.  HENT:     Head: Normocephalic and atraumatic.     Right Ear: Tympanic membrane, ear canal and external ear normal. No tenderness. No middle ear effusion. There is no impacted cerumen. Tympanic membrane is not perforated, erythematous, retracted or bulging.     Left Ear: Tympanic membrane, ear canal and external ear normal. No tenderness.  No middle ear effusion. There is no impacted cerumen. Tympanic membrane is not perforated, erythematous, retracted or bulging.     Nose: Nose normal. No congestion.     Mouth/Throat:     Mouth: Mucous membranes are moist.     Pharynx: Uvula midline. Posterior oropharyngeal erythema present. No oropharyngeal exudate.     Comments: Tonsils are small. Posterior pharyngeal erythema and cobblestoning. Eyes:     Extraocular Movements: Extraocular movements intact.     Pupils: Pupils are equal, round, and reactive to light.  Cardiovascular:     Rate and Rhythm: Normal rate and regular rhythm.     Heart sounds: Normal heart sounds.  Pulmonary:     Effort: Pulmonary effort is normal.     Breath sounds: Normal breath sounds. No decreased breath sounds, wheezing, rhonchi or rales.  Abdominal:     Palpations: Abdomen is soft.  Tenderness: There is no abdominal tenderness. There is no guarding or rebound.  Lymphadenopathy:     Cervical:  No cervical adenopathy.     Right cervical: No superficial cervical adenopathy.    Left cervical: No superficial cervical adenopathy.  Neurological:     General: No focal deficit present.     Mental Status: She is alert and oriented to person, place, and time.  Psychiatric:        Mood and Affect: Mood normal.        Behavior: Behavior normal.        Thought Content: Thought content normal.        Judgment: Judgment normal.     UC Treatments / Results  Labs (all labs ordered are listed, but only abnormal results are displayed) Labs Reviewed - No data to display  EKG   Radiology No results found.  Procedures Procedures (including critical care time)  Medications Ordered in UC Medications - No data to display  Initial Impression / Assessment and Plan / UC Course  I have reviewed the triage vital signs and the nursing notes.  Pertinent labs & imaging results that were available during my care of the patient were reviewed by me and considered in my medical decision making (see chart for details).     This patient is a very pleasant 56 y.o. year old female presenting with sinusitis following allergic rhinitis. Afebrile, nontachy. Nontoxic appearing, oxygenating comfortably on room air with no adventitious breath sounds. Penicillin allergic. Doxycycline sent. Refilled albuterol for asthma component. Continue daily zyrtec, and rec daily flonase. ED return precautions discussed. Patient verbalizes understanding and agreement. -Coding Level 4 for acute exacerbation of chronic condition and prescription drug management. .   Final Clinical Impressions(s) / UC Diagnoses   Final diagnoses:  Sinobronchitis  Mild intermittent asthma with acute exacerbation  Medication refill  Penicillin allergy     Discharge Instructions      -Start the antibiotic, doxycycline twice daily for 7 days.  Make sure to wear sunscreen while spending periods of time outside on this medication as it  can increase your chance of sunburn. -Albuterol inhaler as needed for cough, wheezing, shortness of breath, 1 to 2 puffs every 6 hours as needed. -Follow-up if symptoms worsen instead of improve - sore throat, facial pain, new shortness of breath, new chest pain, coughing up red or dark sputum, etc.    ED Prescriptions     Medication Sig Dispense Auth. Provider   doxycycline (VIBRAMYCIN) 100 MG capsule Take 1 capsule (100 mg total) by mouth 2 (two) times daily for 7 days. 14 capsule Hazel Sams, PA-C   albuterol (VENTOLIN HFA) 108 (90 Base) MCG/ACT inhaler Inhale 1-2 puffs into the lungs every 6 (six) hours as needed for wheezing or shortness of breath. 1 each Hazel Sams, PA-C      PDMP not reviewed this encounter.   Hazel Sams, PA-C 06/04/21 1338

## 2021-06-04 NOTE — ED Triage Notes (Signed)
Reports onset of symptoms one week ago.  Initially mouth felt like she had " burned " her tongue, then sore throat, cough, thick yellow phlegm, stuffy nose, and right side of face pain. Took dayquil, tylenol.

## 2022-02-18 IMAGING — CR DG TOE GREAT 2+V*L*
3 series · 3 of 3 positions shown · non-contrast
Comparison: None.

CLINICAL DATA: Left great toe pain and swelling after an injury.
Bruising. Initial encounter.

EXAM:
LEFT GREAT TOE

[toe ap]
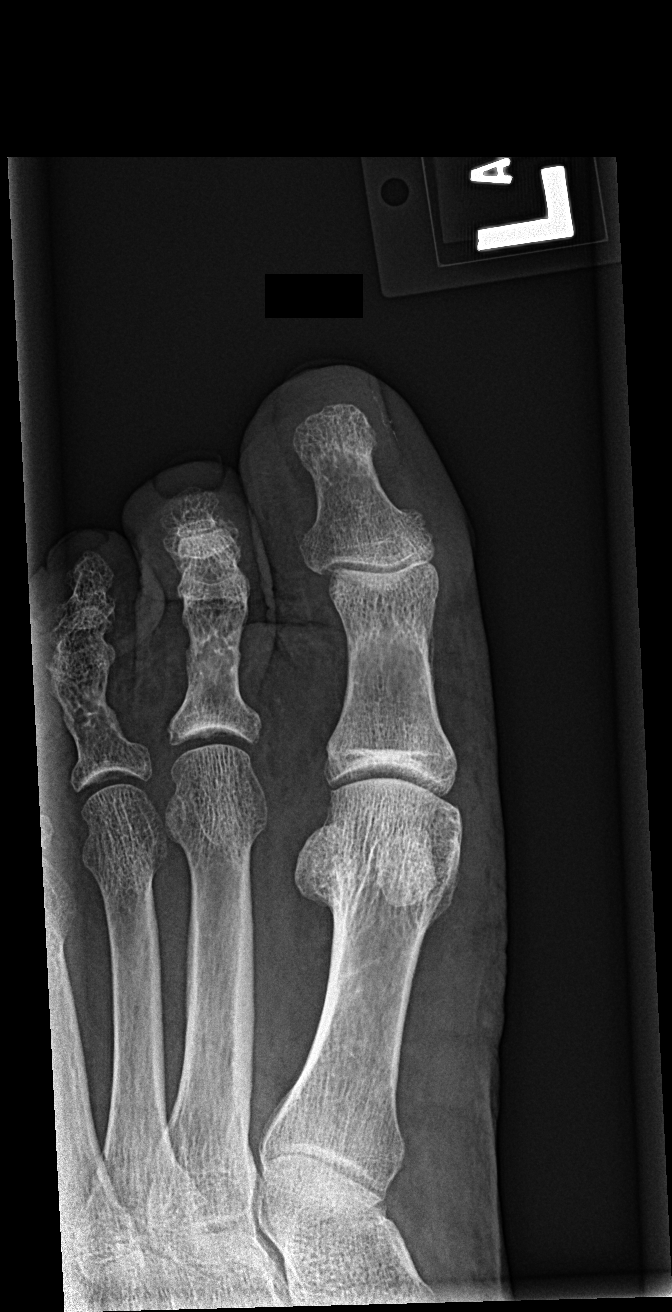

[toe obl]
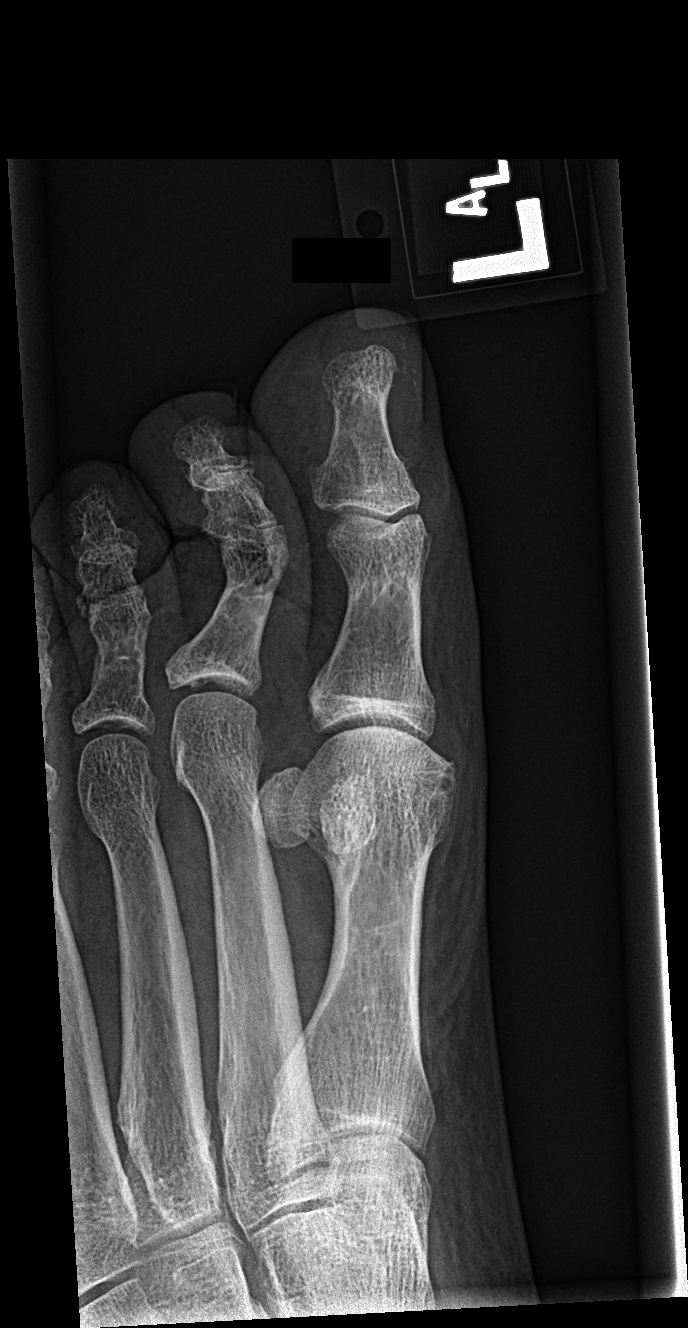

[toe lat]
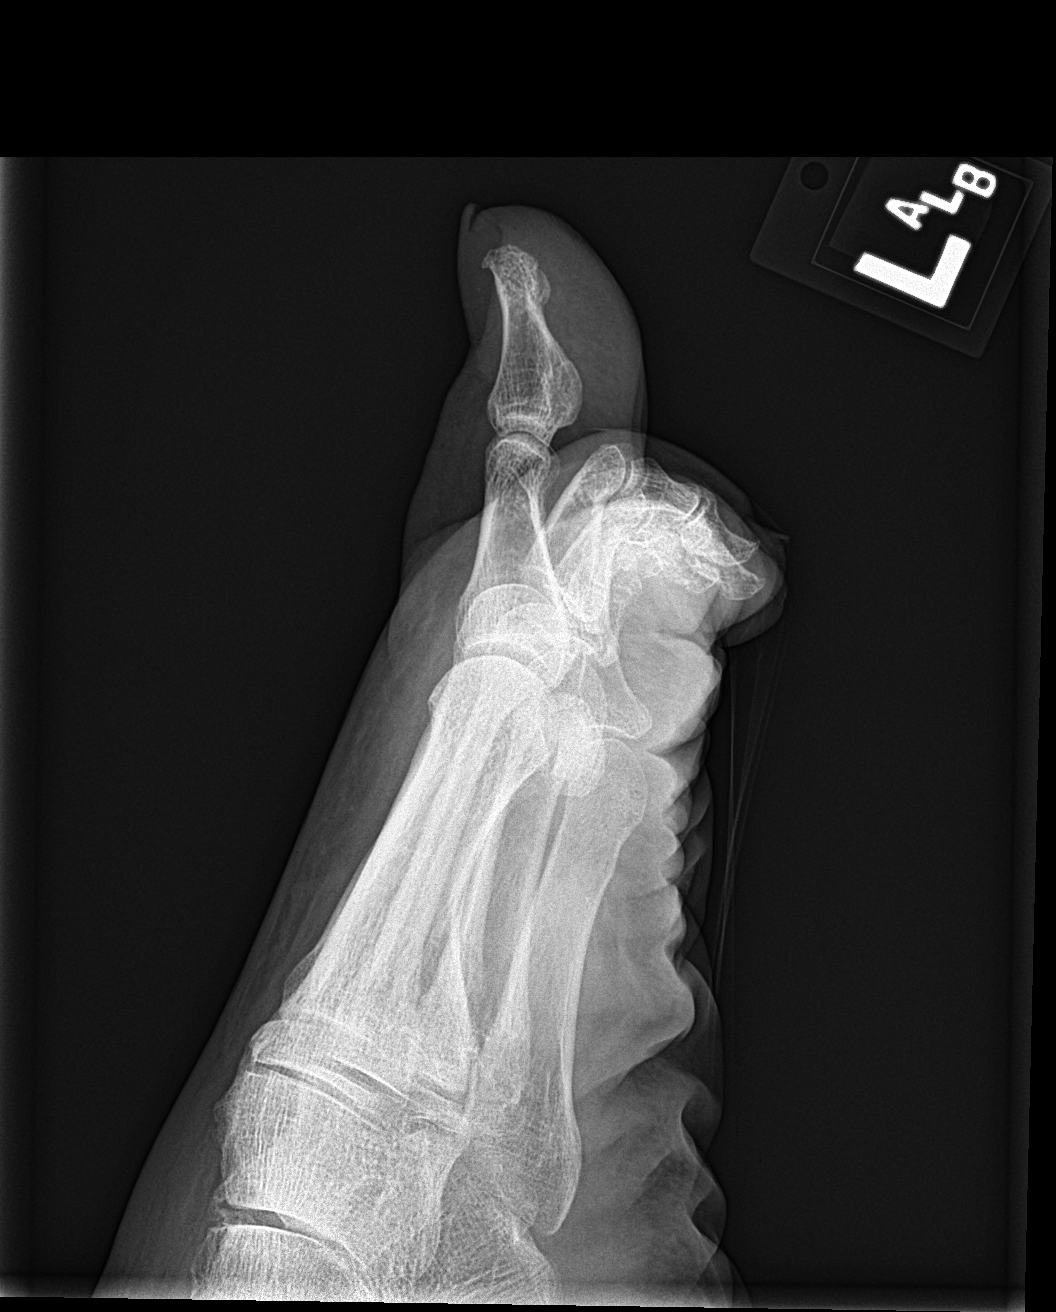

[3 of 3 positions shown; findings below may reference images not displayed]

FINDINGS: No acute osseous or joint abnormality.
IMPRESSION: No acute osseous or joint abnormality.

## 2022-03-02 ENCOUNTER — Encounter: Payer: Self-pay | Admitting: Emergency Medicine

## 2022-03-02 ENCOUNTER — Ambulatory Visit
Admission: EM | Admit: 2022-03-02 | Discharge: 2022-03-02 | Disposition: A | Discharge status: Home / Self Care | Payer: Managed Care, Other (non HMO) | Attending: Family Medicine | Admitting: Family Medicine

## 2022-03-02 ENCOUNTER — Ambulatory Visit (INDEPENDENT_AMBULATORY_CARE_PROVIDER_SITE_OTHER): Payer: Managed Care, Other (non HMO)

## 2022-03-02 DIAGNOSIS — Z20818 Contact with and (suspected) exposure to other bacterial communicable diseases: Secondary | ICD-10-CM | POA: Insufficient documentation

## 2022-03-02 DIAGNOSIS — R079 Chest pain, unspecified: Secondary | ICD-10-CM | POA: Diagnosis not present

## 2022-03-02 DIAGNOSIS — U071 COVID-19: Secondary | ICD-10-CM | POA: Insufficient documentation

## 2022-03-02 DIAGNOSIS — Z9081 Acquired absence of spleen: Secondary | ICD-10-CM

## 2022-03-02 DIAGNOSIS — R059 Cough, unspecified: Secondary | ICD-10-CM

## 2022-03-02 LAB — RESP PANEL BY RT-PCR (RSV, FLU A&B, COVID)  RVPGX2
Influenza A by PCR: NEGATIVE
Influenza B by PCR: NEGATIVE
Resp Syncytial Virus by PCR: NEGATIVE
SARS Coronavirus 2 by RT PCR: POSITIVE — AB

## 2022-03-02 LAB — GROUP A STREP BY PCR: Group A Strep by PCR: NOT DETECTED

## 2022-03-02 NOTE — ED Triage Notes (Signed)
Pt was diagnosed with Covid on 02/25/22 she started Paxlovid on 02/27/22. Yesterday she started to have right side back and abdomen pain that is worse when she coughs.

## 2022-03-02 NOTE — Discharge Instructions (Signed)
Your strep test is negative. You do not have the flu or RSV.  Continue taking Paxlovid for COVID.  If you have increasing shortness of breath, go to the emergency department.   As your blood pressure was somewhat low today, hold your blood pressure medication and reassess the need tomorrow.    Follow up with your primary care provider

## 2022-03-02 NOTE — ED Provider Notes (Signed)
[facility]    CSN: [id number] Arrival date & time: 03/02/22  1043      History   Chief Complaint Chief Complaint  Patient presents with   Cough    Covid pos 02/25/22, son pos for strepp yesterday. I have no spleen. My symptoms worse,  front and back ribcage area oddly hurt bad from cough - Entered by patient   Back Pain   Abdominal Pain    HPI Marisa Lewis is a 57 y.o. female.   HPI   Marisa Lewis presents strep exposure with known COVID. Monday night had a [employer] telemedicine visit and was prescribed Paxlovid.  She starting taking Paxlovid on Wednesday. Coughing began over the weekend while she and her family were out of town.  No one else in the house has COVID. Her 4 yo son tested positive for strep yesterday.    Has chills without known fever. She feels tired and has pain in her abdomen and feels "zapping pain in my lungs" with coughing. Pain radiates to her back. She says she coughed so bad in the past that she "popped my chest bone out."    She started feeling nauseous this morning. Has headache and fatigue with ongoing cough.  Sore throat has resolved. No rash but felt like her body was more red after the shower this morning.   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   Denies vomiting, chest pain, dizziness or diarrhea.      Past Medical History:  Diagnosis Date   Asthma    rare   GERD (gastroesophageal reflux disease)    Migraine    approx 1x/wk   Sciatica of left side     Patient Active Problem List   Diagnosis Date Noted   Spondylosis without myelopathy or radiculopathy, lumbosacral region 09/21/2019   Elevated vitamin B12 level 07/28/2019   Chronic pain syndrome 06/28/2019   Pharmacologic therapy 06/28/2019   Disorder of skeletal system 06/28/2019   Problems influencing health status 06/28/2019   Chronic lower extremity pain (1ry area of Pain) (Left) 06/28/2019   Chronic low back pain (2ry area of Pain)  (Left) w/o sciatica 06/28/2019   Lumbar facet joint syndrome (Bilateral) (L>R) 06/28/2019   Chronic sacroiliac joint pain (Right) 06/28/2019   DDD (degenerative disc disease), lumbar 06/28/2019   Abnormal MRI, lumbar spine (05/21/2019) 06/28/2019   Lumbar central spinal stenosis, 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, L5-S1) 06/28/2019   Lumbosacral lateral recess stenosis (L5-S1) (Right) 06/28/2019   Lumbosacral facet arthropathy (L4-5) (R>L) 06/28/2019   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/28/2019   Abdominal pain 06/27/2019   Gastroesophageal reflux disease 06/27/2019   Left lower quadrant pain 06/27/2019   Morbid obesity ([place]) 06/27/2019   Rectal bleeding    Polyp of sigmoid colon    Internal hemorrhoids    Morbid obesity with BMI of 40.0-44.9, adult ([place]) 03/12/2018    Past Surgical History:  Procedure Laterality Date   ABDOMINAL HYSTERECTOMY     COLONOSCOPY WITH PROPOFOL N/A 06/16/2018   Procedure: COLONOSCOPY WITH BIOPSIES;  Surgeon: Virgel Manifold, MD;  Location: [place];  Service: Endoscopy;  Laterality: N/A;   GASTRIC BYPASS  1997 and 2005   POLYPECTOMY N/A 06/16/2018   Procedure: POLYPECTOMY;  Surgeon: Virgel Manifold, MD;  Location: [place];  Service: Endoscopy;  Laterality: N/A;   SPLENECTOMY, TOTAL     UMBILICAL HERNIA REPAIR      OB History   No  obstetric history on file.      Home Medications    Prior to Admission medications   Medication Sig Start Date End Date Taking? Authorizing Provider  albuterol (VENTOLIN HFA) 108 (90 Base) MCG/ACT inhaler Inhale 1-2 puffs into the lungs every 6 (six) hours as needed for wheezing or shortness of breath. 06/04/21   Hazel Sams, PA-C  benzonatate (TESSALON) 100 MG capsule Take 1 capsule (100 mg total) by mouth every 8 (eight) hours. Patient not taking: Reported on 06/04/2021 02/24/21   Hans Eden, NP  buPROPion (WELLBUTRIN XL) 300 MG 24 hr tablet Take 1 tablet by mouth daily.    [provider]  cetirizine (ZYRTEC) 10 MG tablet Take 10 mg by mouth daily.    [provider]  citalopram (CELEXA) 40 MG tablet Take 40 mg by mouth daily. 07/05/19   [provider]  cyclobenzaprine (FLEXERIL) 10 MG tablet Take 10 mg by mouth 3 (three) times daily as needed for muscle spasms.    [provider]  eletriptan (RELPAX) 40 MG tablet Take 40 mg by mouth as needed for migraine or headache. May repeat in 2 hours if headache persists or recurs.    [provider]  ferrous sulfate 325 (65 FE) MG tablet Take 325 mg by mouth daily with breakfast.    [provider]  flavoxATE (URISPAS) 100 MG tablet Take 100 mg by mouth as needed.    [provider]  FLUoxetine (PROZAC) 20 MG capsule Take 1 tablet by mouth daily.    [provider]  fluticasone (FLONASE) 50 MCG/ACT nasal spray Place 2 sprays into both nostrils daily.    [provider]  gabapentin (NEURONTIN) 300 MG capsule Take 300 mg by mouth 3 (three) times daily.    [provider]  Iron-Vitamin C (VITRON-C) 65-125 MG TABS Take 1 tablet by mouth daily. 06/07/16   [provider]  lidocaine (XYLOCAINE) 2 % solution Use as directed 15 mLs in the mouth or throat as needed for mouth pain. 02/21/21   Sharion Balloon, NP  naproxen (NAPROSYN) 500 MG tablet Take 1 tablet (500 mg total) by mouth 2 (two) times daily. 07/30/19   Luvenia Redden, PA-C  omeprazole (PRILOSEC) 40 MG capsule Take 40 mg by mouth daily.    [provider]  Promethazine HCl (PHENERGAN PO) Take 10 mg by mouth.    [provider]  promethazine-dextromethorphan (PROMETHAZINE-DM) 6.25-15 MG/5ML syrup Take 5 mLs by mouth 4 (four) times daily as needed for cough. Patient not taking: Reported on 06/04/2021 02/24/21   Hans Eden, NP  SAXENDA 18 MG/3ML [facility]  09/19/17   [provider]  SUMAtriptan (IMITREX) 6 MG/0.5ML SOLN injection Inject 6 mg into the skin every 2  (two) hours as needed for migraine or headache. May repeat in 2 hours if headache persists or recurs.    [provider]  topiramate (TOPAMAX) 100 MG tablet Take 100 mg by mouth 2 (two) times daily.    [provider]  traMADol (ULTRAM) 50 MG tablet Take 1 tablet (50 mg total) by mouth every 8 (eight) hours as needed. Patient not taking: Reported on 06/04/2021 07/15/18   Coral Spikes, DO  triamterene-hydrochlorothiazide (MAXZIDE-25) 37.5-25 MG tablet Take 1 tablet by mouth daily. 07/01/19   [provider]  vitamin B-12 (CYANOCOBALAMIN) 1000 MCG tablet Take 2,000 mcg by mouth daily.    [provider]  Vitamin D, Ergocalciferol, (DRISDOL) 1.25 MG (50000  UT) CAPS capsule Take 50,000 Units by mouth every 7 (seven) days.  02/16/18   [provider]  zolpidem (AMBIEN) 5 MG tablet Take 5 mg by mouth at bedtime as needed for sleep.    [provider]    Family History Family History  Problem Relation Age of Onset   Kidney failure Mother    Diabetes Mother    Hyperlipidemia Mother    COPD Mother    Diabetes Father     Social History Social History   Tobacco Use   Smoking status: Former    Types: Cigarettes    Quit date: 12/31/2014    Years since quitting: 7.1   Smokeless tobacco: Never  Vaping Use   Vaping Use: Never used  Substance Use Topics   Alcohol use: No   Drug use: No     Allergies   Penicillins, Aspirin, and Morphine and related   Review of Systems Review of Systems: negative unless otherwise stated in HPI.      Physical Exam Triage Vital Signs ED Triage Vitals  Enc Vitals Group     BP 03/02/22 1115 (!) 89/54     Pulse Rate 03/02/22 1115 72     Resp 03/02/22 1115 18     Temp --      Temp src --      SpO2 03/02/22 1115 100 %     Weight --      Height --      Head Circumference --      Peak Flow --      Pain Score 03/02/22 1112 4     Pain Loc --      Pain Edu? --      Excl. in [place]? --    No data  found.  Updated Vital Signs BP 103/69 (BP Location: Left Arm)   Pulse 72   Resp 18   SpO2 100%   Visual Acuity Right Eye Distance:   Left Eye Distance:   Bilateral Distance:    Right Eye Near:   Left Eye Near:    Bilateral Near:     Physical Exam GEN:     alert, non-toxic appearing female in no distress    HENT:  mucus membranes moist, oropharyngeal without lesions or exudate, no tonsillar hypertrophy,  mild oropharyngeal erythema, moderate erythematous edematous turbinates, clear nasal discharge EYES:   pupils equal and reactive, no scleral injection or discharge NECK:  normal ROM, no meningismus   RESP:  no increased work of breathing, clear to auscultation bilaterally CVS:   regular rate and rhythm Skin:   warm and dry, no rash on visible skin    UC Treatments / Results  Labs (all labs ordered are listed, but only abnormal results are displayed) Labs Reviewed  RESP PANEL BY RT-PCR (RSV, FLU A&B, COVID)  [id number] - Abnormal; Notable for the following components:      Result Value   SARS Coronavirus 2 by RT PCR POSITIVE (*)    All other components within normal limits  GROUP A STREP BY PCR    EKG   Radiology DG Chest 2 View  Result Date: 03/02/2022 CLINICAL DATA:  Cough and chest pain. Positive home COVID test 5 days ago. EXAM: CHEST - 2 VIEW COMPARISON:  None Available. FINDINGS: Cardiac silhouette and mediastinal contours are within normal limits. The lungs are clear. No pleural effusion or pneumothorax. Mild-to-moderate multilevel degenerative disc changes of thoracic spine. IMPRESSION: No active cardiopulmonary disease. Electronically Signed  By: Yvonne Kendall M.D.   On: 03/02/2022 12:53    Procedures Procedures (including critical care time)  Medications Ordered in UC Medications - No data to display  Initial Impression / Assessment and Plan / UC Course  I have reviewed the triage vital signs and the nursing notes.  Pertinent labs & imaging results that  were available during my care of the patient were reviewed by me and considered in my medical decision making (see chart for details).       Pt is a 57 y.o. female who presents respiratory symptoms with recent strep exposure and known COVID. Kessa is afebrile here without recent antipyretics. Satting well on room air. She was initially hypotensive but this resolved without treatment.   Overall pt is non-toxic appearing, well hydrated, without respiratory distress. Pulmonary exam is unremarkable.  COVID positive as suspected.  RSV and influenza testing was negative. Chest xray personally reviewed by me without focal pneumonia, pleural effusion, cardiomegaly or pneumothorax.   Strep PCR is negative. She has acquired asplenia. I suspect her symptoms are sequela of current COVID infection. Discussed symptomatic treatment.  Explained lack of efficacy of antibiotics in viral disease.  Typical duration of symptoms discussed.   Return and ED precautions given and voiced understanding. Discussed MDM, treatment plan and plan for follow-up with patient who agrees with plan.     Final Clinical Impressions(s) / UC Diagnoses   Final diagnoses:  COVID-19  Streptococcus exposure  Acquired asplenia     Discharge Instructions      Your strep test is negative. You do not have the flu or RSV.  Continue taking Paxlovid for COVID.  If you have increasing shortness of breath, go to the emergency department.   As your blood pressure was somewhat low today, hold your blood pressure medication and reassess the need tomorrow.    Follow up with your primary care provider      ED Prescriptions   None    PDMP not reviewed this encounter.   Lyndee Hensen, DO 03/03/22 1248

## 2022-05-20 ENCOUNTER — Other Ambulatory Visit: Payer: Self-pay | Admitting: Medical

## 2022-05-20 DIAGNOSIS — I809 Phlebitis and thrombophlebitis of unspecified site: Secondary | ICD-10-CM

## 2022-05-20 DIAGNOSIS — M7989 Other specified soft tissue disorders: Secondary | ICD-10-CM

## 2022-05-21 ENCOUNTER — Ambulatory Visit
Admission: RE | Admit: 2022-05-21 | Discharge: 2022-05-21 | Disposition: A | Discharge status: Home / Self Care | Payer: Managed Care, Other (non HMO) | Source: Ambulatory Visit | Attending: Medical | Admitting: Medical

## 2022-05-21 DIAGNOSIS — I809 Phlebitis and thrombophlebitis of unspecified site: Secondary | ICD-10-CM

## 2022-05-21 DIAGNOSIS — M7989 Other specified soft tissue disorders: Secondary | ICD-10-CM

## 2023-10-13 ENCOUNTER — Ambulatory Visit
Admission: EM | Admit: 2023-10-13 | Discharge: 2023-10-13 | Disposition: A | Discharge status: Home / Self Care | Attending: Physician Assistant | Admitting: Physician Assistant

## 2023-10-13 DIAGNOSIS — R0602 Shortness of breath: Secondary | ICD-10-CM | POA: Diagnosis present

## 2023-10-13 DIAGNOSIS — J069 Acute upper respiratory infection, unspecified: Secondary | ICD-10-CM | POA: Diagnosis present

## 2023-10-13 DIAGNOSIS — R051 Acute cough: Secondary | ICD-10-CM | POA: Insufficient documentation

## 2023-10-13 DIAGNOSIS — J45909 Unspecified asthma, uncomplicated: Secondary | ICD-10-CM | POA: Diagnosis present

## 2023-10-13 DIAGNOSIS — J029 Acute pharyngitis, unspecified: Secondary | ICD-10-CM | POA: Diagnosis not present

## 2023-10-13 LAB — GROUP A STREP BY PCR: Group A Strep by PCR: NOT DETECTED

## 2023-10-13 LAB — SARS CORONAVIRUS 2 BY RT PCR: SARS Coronavirus 2 by RT PCR: NEGATIVE

## 2023-10-13 MED ORDER — PREDNISONE 20 MG PO TABS: 40.0000 mg | ORAL_TABLET | Freq: Every day | ORAL | 0 refills | Status: AC

## 2023-10-13 MED ORDER — PROMETHAZINE-DM 6.25-15 MG/5ML PO SYRP: 5.0000 mL | ORAL_SOLUTION | Freq: Four times a day (QID) | ORAL | 0 refills | Status: AC | PRN

## 2023-10-13 NOTE — Discharge Instructions (Signed)
 URI/COLD SYMPTOMS: Your exam today is consistent with a viral illness. Antibiotics are not indicated at this time. Use medications as directed, including cough syrup, nasal saline, and decongestants. Your symptoms should improve over the next few days and resolve within a couple of weeks. Increase rest and fluids. F/u if symptoms worsen or predominate such as sore throat, ear pain, productive cough, shortness of breath, or if you develop high fevers or worsening fatigue over the next several days.

## 2023-10-13 NOTE — ED Provider Notes (Signed)
 [facility]    CSN: [id number] Arrival date & time: 10/13/23  0802      History   Chief Complaint Chief Complaint  Patient presents with   Cough   Sore Throat    HPI Marisa Lewis is a 58 y.o. female presenting for cough, congestion, headache, and sore throat that began this morning. Reports shortness of breath during coughing fits. Has used albuterol  inhaler with relief. Denies fever, ear pain, sinus pain, chest pain, wheezing, abdominal pain, vomiting or diarrhea.  Patient has not been taking over-the-counter meds. History of asthma and chronic pain.  HPI  Past Medical History:  Diagnosis Date   Asthma    rare   GERD (gastroesophageal reflux disease)    Migraine    approx 1x/wk   Sciatica of left side     Patient Active Problem List   Diagnosis Date Noted   Spondylosis without myelopathy or radiculopathy, lumbosacral region 09/21/2019   Elevated vitamin B12 level 07/28/2019   Chronic pain syndrome 06/28/2019   Pharmacologic therapy 06/28/2019   Disorder of skeletal system 06/28/2019   Problems influencing health status 06/28/2019   Chronic lower extremity pain (1ry area of Pain) (Left) 06/28/2019   Chronic low back pain (2ry area of Pain) (Left) w/o sciatica 06/28/2019   Lumbar facet joint syndrome (Bilateral) (L>R) 06/28/2019   Chronic sacroiliac joint pain (Right) 06/28/2019   DDD (degenerative disc disease), lumbar 06/28/2019   Abnormal MRI, lumbar spine (05/21/2019) 06/28/2019   Lumbar central spinal stenosis, 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, L5-S1) 06/28/2019   Lumbosacral lateral recess stenosis (L5-S1) (Right) 06/28/2019   Lumbosacral facet arthropathy (L4-5) (R>L) 06/28/2019   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/28/2019   Abdominal pain 06/27/2019   Gastroesophageal reflux disease 06/27/2019   Left lower quadrant pain 06/27/2019   Morbid obesity ([facility]) 06/27/2019   Rectal bleeding    Polyp of sigmoid colon    Internal hemorrhoids    Morbid  obesity with BMI of 40.0-44.9, adult ([facility]) 03/12/2018    Past Surgical History:  Procedure Laterality Date   ABDOMINAL HYSTERECTOMY     COLONOSCOPY WITH PROPOFOL  N/A 06/16/2018   Procedure: COLONOSCOPY WITH BIOPSIES;  Surgeon: Janalyn Keene NOVAK, MD;  Location: [facility];  Service: Endoscopy;  Laterality: N/A;   GASTRIC BYPASS  1997 and 2005   POLYPECTOMY N/A 06/16/2018   Procedure: POLYPECTOMY;  Surgeon: Janalyn Keene NOVAK, MD;  Location: [facility];  Service: Endoscopy;  Laterality: N/A;   SPLENECTOMY, TOTAL     UMBILICAL HERNIA REPAIR      OB History   No obstetric history on file.      Home Medications    Prior to Admission medications   Medication Sig Start Date End Date Taking? Authorizing Provider  AIMOVIG 140 MG/ML SOAJ inject 1ml every 24 days for migraine prevention 09/03/23  Yes [provider]  buPROPion (WELLBUTRIN XL) 300 MG 24 hr tablet Take 1 tablet by mouth daily.   Yes [provider]  eletriptan (RELPAX) 40 MG tablet Take 40 mg by mouth as needed for migraine or headache. May repeat in 2 hours if headache persists or recurs.   Yes [provider]  FLUoxetine (PROZAC) 20 MG capsule Take 1 tablet by mouth daily.   Yes [provider]  gabapentin (NEURONTIN) 300 MG capsule Take 300 mg by mouth 3 (three) times daily.   Yes [provider]  omeprazole (PRILOSEC) 40 MG capsule Take 40 mg by mouth daily.   Yes [provider]  predniSONE  (DELTASONE ) 20 MG tablet Take 2 tablets (40 mg total) by mouth daily for 5 days. 10/13/23 10/18/23 Yes Arvis Huxley B, PA-C  promethazine -dextromethorphan (PROMETHAZINE -DM) 6.25-15 MG/5ML syrup Take 5 mLs by mouth 4 (four) times daily as needed. 10/13/23  Yes Arvis Huxley B, PA-C  ZEPBOUND 10 MG/0.5ML Pen ADMINISTER 10 MG UNDER THE SKIN EVERY WEEK   Yes [provider]  albuterol  (VENTOLIN  HFA) 108 (90 Base) MCG/ACT inhaler Inhale 1-2 puffs into the lungs  every 6 (six) hours as needed for wheezing or shortness of breath. 06/04/21   Arlyss Leita BRAVO, PA-C  benzonatate  (TESSALON ) 100 MG capsule Take 1 capsule (100 mg total) by mouth every 8 (eight) hours. Patient not taking: Reported on 06/04/2021 02/24/21   Teresa Shelba SAUNDERS, NP  cetirizine (ZYRTEC) 10 MG tablet Take 10 mg by mouth daily.    [provider]  citalopram (CELEXA) 40 MG tablet Take 40 mg by mouth daily. 07/05/19   [provider]  cyclobenzaprine (FLEXERIL) 10 MG tablet Take 10 mg by mouth 3 (three) times daily as needed for muscle spasms.    [provider]  ferrous sulfate 325 (65 FE) MG tablet Take 325 mg by mouth daily with breakfast.    [provider]  flavoxATE (URISPAS) 100 MG tablet Take 100 mg by mouth as needed.    [provider]  fluticasone (FLONASE) 50 MCG/ACT nasal spray Place 2 sprays into both nostrils daily.    [provider]  Iron-Vitamin C (VITRON-C) 65-125 MG TABS Take 1 tablet by mouth daily. 06/07/16   [provider]  lidocaine  (XYLOCAINE ) 2 % solution Use as directed 15 mLs in the mouth or throat as needed for mouth pain. 02/21/21   Corlis Burnard DEL, NP  naproxen  (NAPROSYN ) 500 MG tablet Take 1 tablet (500 mg total) by mouth 2 (two) times daily. 07/30/19   Arloa Ozell BIRCH, PA-C  Promethazine  HCl (PHENERGAN  PO) Take 10 mg by mouth.    [provider]  SAXENDA 18 MG/3ML [facility]  09/19/17   [provider]  SUMAtriptan (IMITREX) 6 MG/0.5ML SOLN injection Inject 6 mg into the skin every 2 (two) hours as needed for migraine or headache. May repeat in 2 hours if headache persists or recurs.    [provider]  topiramate (TOPAMAX) 100 MG tablet Take 100 mg by mouth 2 (two) times daily.    [provider]  traMADol  (ULTRAM ) 50 MG tablet Take 1 tablet (50 mg total) by mouth every 8 (eight) hours as needed. Patient not taking: Reported on 06/04/2021 07/15/18   Cook, Jayce G, DO   triamterene-hydrochlorothiazide (MAXZIDE-25) 37.5-25 MG tablet Take 1 tablet by mouth daily. 07/01/19   [provider]  vitamin B-12 (CYANOCOBALAMIN) 1000 MCG tablet Take 2,000 mcg by mouth daily.    [provider]  Vitamin D , Ergocalciferol , (DRISDOL) 1.25 MG (50000 UT) CAPS capsule Take 50,000 Units by mouth every 7 (seven) days.  02/16/18   [provider]  zolpidem (AMBIEN) 5 MG tablet Take 5 mg by mouth at bedtime as needed for sleep.    [provider]    Family History Family History  Problem Relation Age of Onset   Kidney failure Mother    Diabetes Mother    Hyperlipidemia Mother    COPD Mother    Diabetes Father     Social History Social History   Tobacco Use   Smoking status: Former    Current packs/day:  0.00    Types: Cigarettes    Quit date: 12/31/2014    Years since quitting: 8.7   Smokeless tobacco: Never  Vaping Use   Vaping status: Never Used  Substance Use Topics   Alcohol use: No   Drug use: No     Allergies   Penicillins, Aspirin, and Morphine and codeine   Review of Systems Review of Systems  Constitutional:  Positive for fatigue. Negative for chills, diaphoresis and fever.  HENT:  Positive for congestion, rhinorrhea and sore throat. Negative for ear pain, sinus pressure and sinus pain.   Respiratory:  Positive for cough and shortness of breath.   Cardiovascular:  Negative for chest pain.  Gastrointestinal:  Negative for abdominal pain, nausea and vomiting.  Musculoskeletal:  Negative for arthralgias and myalgias.  Skin:  Negative for rash.  Neurological:  Positive for headaches. Negative for weakness.  Hematological:  Negative for adenopathy.     Physical Exam Triage Vital Signs ED Triage Vitals  Encounter Vitals Group     BP      Girls Systolic BP Percentile      Girls Diastolic BP Percentile      Boys Systolic BP Percentile      Boys Diastolic BP Percentile      Pulse      Resp      Temp       Temp src      SpO2      Weight      Height      Head Circumference      Peak Flow      Pain Score      Pain Loc      Pain Education      Exclude from Growth Chart    No data found.  Updated Vital Signs BP 97/63 (BP Location: Right Arm)   Pulse 70   Temp 98 F (36.7 C) (Oral)   Resp 18   Wt 168 lb (76.2 kg)   SpO2 97%   BMI 31.74 kg/m      Physical Exam Vitals and nursing note reviewed.  Constitutional:      General: She is not in acute distress.    Appearance: Normal appearance. She is not ill-appearing or toxic-appearing.  HENT:     Head: Normocephalic and atraumatic.     Nose: Congestion present.     Mouth/Throat:     Mouth: Mucous membranes are moist.     Pharynx: Oropharynx is clear. Posterior oropharyngeal erythema present.  Eyes:     General: No scleral icterus.       Right eye: No discharge.        Left eye: No discharge.     Conjunctiva/sclera: Conjunctivae normal.  Cardiovascular:     Rate and Rhythm: Normal rate and regular rhythm.     Heart sounds: Normal heart sounds.  Pulmonary:     Effort: Pulmonary effort is normal. No respiratory distress.     Breath sounds: Normal breath sounds.  Musculoskeletal:     Cervical back: Neck supple.  Skin:    General: Skin is dry.  Neurological:     General: No focal deficit present.     Mental Status: She is alert. Mental status is at baseline.     Motor: No weakness.     Gait: Gait normal.  Psychiatric:        Mood and Affect: Mood normal.        Behavior: Behavior normal.  UC Treatments / Results  Labs (all labs ordered are listed, but only abnormal results are displayed) Labs Reviewed  GROUP A STREP BY PCR  SARS CORONAVIRUS 2 BY RT PCR    EKG   Radiology No results found.  Procedures Procedures (including critical care time)  Medications Ordered in UC Medications - No data to display  Initial Impression / Assessment and Plan / UC Course  I have reviewed the triage vital signs and  the nursing notes.  Pertinent labs & imaging results that were available during my care of the patient were reviewed by me and considered in my medical decision making (see chart for details).   58 y/o female with history of asthma and chronic pain presents for cough, congestion, headache and sore throat that began in the last couple of hours. Reports SOB with coughing fits.  Vitals stable and normal. Overall well appearing. NAD. On exam, she has nasal congestion and mild erythema of posterior pharynx. Chest clear.   COVID and strep testing obtained. Negative testing.   Viral illness. Supportive care advised. Sent promethazine  DM to pharmacy. Printed prescription for prednisone  in case breathing worsens.  Lungs are clear at this time.  Advised to continue using inhaler.  If increased shortness of breath or wheezing she should start the prednisone .  Advised her to return here for reevaluation if she develops fever or acute worsening of any symptoms.   Final Clinical Impressions(s) / UC Diagnoses   Final diagnoses:  Viral upper respiratory tract infection  Acute cough  Sore throat  Shortness of breath  Asthma, unspecified asthma severity, unspecified whether complicated, unspecified whether persistent     Discharge Instructions      URI/COLD SYMPTOMS: Your exam today is consistent with a viral illness. Antibiotics are not indicated at this time. Use medications as directed, including cough syrup, nasal saline, and decongestants. Your symptoms should improve over the next few days and resolve within a couple of weeks. Increase rest and fluids. F/u if symptoms worsen or predominate such as sore throat, ear pain, productive cough, shortness of breath, or if you develop high fevers or worsening fatigue over the next several days.       ED Prescriptions     Medication Sig Dispense Auth. Provider   promethazine -dextromethorphan (PROMETHAZINE -DM) 6.25-15 MG/5ML syrup Take 5 mLs by mouth 4  (four) times daily as needed. 118 mL Arvis Huxley B, PA-C   predniSONE  (DELTASONE ) 20 MG tablet Take 2 tablets (40 mg total) by mouth daily for 5 days. 10 tablet Jeweldean Drohan B, PA-C      PDMP not reviewed this encounter.   Arvis Huxley NOVAK, PA-C 10/13/23 [phone]

## 2023-10-13 NOTE — ED Triage Notes (Signed)
 Patient states that she woke up with a sore throat and cough, headache.no meds to help

## 2023-10-19 ENCOUNTER — Telehealth: Admitting: Physician Assistant

## 2023-10-19 DIAGNOSIS — J019 Acute sinusitis, unspecified: Secondary | ICD-10-CM | POA: Diagnosis not present

## 2023-10-19 DIAGNOSIS — J4521 Mild intermittent asthma with (acute) exacerbation: Secondary | ICD-10-CM

## 2023-10-19 MED ORDER — DOXYCYCLINE HYCLATE 100 MG PO TABS: 100.0000 mg | ORAL_TABLET | Freq: Two times a day (BID) | ORAL | 0 refills | Status: AC

## 2023-10-19 NOTE — Progress Notes (Signed)
 Virtual Visit Consent   Marisa Lewis, you are scheduled for a virtual visit with a [facility] provider today. Just as with appointments in the office, your consent must be obtained to participate. Your consent will be active for this visit and any virtual visit you may have with one of our providers in the next 365 days. If you have a MyChart account, a copy of this consent can be sent to you electronically.  As this is a virtual visit, video technology does not allow for your provider to perform a traditional examination. This may limit your provider's ability to fully assess your condition. If your provider identifies any concerns that need to be evaluated in person or the need to arrange testing (such as labs, EKG, etc.), we will make arrangements to do so. Although advances in technology are sophisticated, we cannot ensure that it will always work on either your end or our end. If the connection with a video visit is poor, the visit may have to be switched to a telephone visit. With either a video or telephone visit, we are not always able to ensure that we have a secure connection.  By engaging in this virtual visit, you consent to the provision of healthcare and authorize for your insurance to be billed (if applicable) for the services provided during this visit. Depending on your insurance coverage, you may receive a charge related to this service.  I need to obtain your verbal consent now. Are you willing to proceed with your visit today? IA LEEB has provided verbal consent on 10/19/2023 for a virtual visit (video or telephone). Harlene PEDLAR Ward, PA-C  Date: 10/19/2023 10:22 AM   Virtual Visit via Video Note   I, Harlene PEDLAR Ward, connected with  MARWA FUHRMAN  ([id number], 05/24/1965) on 10/19/23 at 10:15 AM EDT by a video-enabled telemedicine application and verified that I am speaking with the correct person using two identifiers.  Location: Patient: Virtual Visit Location Patient:  Home Provider: Virtual Visit Location Provider: Home Office   I discussed the limitations of evaluation and management by telemedicine and the availability of in person appointments. The patient expressed understanding and agreed to proceed.    History of Present Illness: TIEISHA DARDEN is a 58 y.o. who identifies as a female who was assigned female at birth, and is being seen today for cough, congestion that started about one week.  She was seen one week ago and tested negative for COVID, RSV, and strep.  She was prescribed cough syrup and prednisone .  She has not started the prednisone . She has been taking otc cold and cough medications.  She has needed her inhaler more than usual this week, reports it is helping. She has h/o asthma.  Denies fevers. She reports some sinus pain and pressure on left side.   HPI: HPI  Problems:  Patient Active Problem List   Diagnosis Date Noted   Spondylosis without myelopathy or radiculopathy, lumbosacral region 09/21/2019   Elevated vitamin B12 level 07/28/2019   Chronic pain syndrome 06/28/2019   Pharmacologic therapy 06/28/2019   Disorder of skeletal system 06/28/2019   Problems influencing health status 06/28/2019   Chronic lower extremity pain (1ry area of Pain) (Left) 06/28/2019   Chronic low back pain (2ry area of Pain) (Left) w/o sciatica 06/28/2019   Lumbar facet joint syndrome (Bilateral) (L>R) 06/28/2019   Chronic sacroiliac joint pain (Right) 06/28/2019   DDD (degenerative disc disease), lumbar 06/28/2019   Abnormal MRI, lumbar  spine (05/21/2019) 06/28/2019   Lumbar central spinal stenosis, 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, L5-S1) 06/28/2019   Lumbosacral lateral recess stenosis (L5-S1) (Right) 06/28/2019   Lumbosacral facet arthropathy (L4-5) (R>L) 06/28/2019   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/28/2019   Abdominal pain 06/27/2019   Gastroesophageal reflux disease 06/27/2019   Left lower quadrant pain 06/27/2019   Morbid obesity (HCC)  06/27/2019   Rectal bleeding    Polyp of sigmoid colon    Internal hemorrhoids    Morbid obesity with BMI of 40.0-44.9, adult (HCC) 03/12/2018    Allergies:  Allergies  Allergen Reactions   Penicillins Anaphylaxis   Aspirin Other (See Comments)    Nose bleed Other reaction(s): Other (See Comments) Nose bleed Nose bleed    Morphine And Codeine Nausea And Vomiting   Medications:  Current Outpatient Medications:    AIMOVIG 140 MG/ML SOAJ, inject 1ml every 24 days for migraine prevention, Disp: , Rfl:    albuterol  (VENTOLIN  HFA) 108 (90 Base) MCG/ACT inhaler, Inhale 1-2 puffs into the lungs every 6 (six) hours as needed for wheezing or shortness of breath., Disp: 1 each, Rfl: 0   benzonatate  (TESSALON ) 100 MG capsule, Take 1 capsule (100 mg total) by mouth every 8 (eight) hours. (Patient not taking: Reported on 06/04/2021), Disp: 21 capsule, Rfl: 0   buPROPion (WELLBUTRIN XL) 300 MG 24 hr tablet, Take 1 tablet by mouth daily., Disp: , Rfl:    cetirizine (ZYRTEC) 10 MG tablet, Take 10 mg by mouth daily., Disp: , Rfl:    citalopram (CELEXA) 40 MG tablet, Take 40 mg by mouth daily., Disp: , Rfl:    cyclobenzaprine (FLEXERIL) 10 MG tablet, Take 10 mg by mouth 3 (three) times daily as needed for muscle spasms., Disp: , Rfl:    doxycycline  (VIBRA -TABS) 100 MG tablet, Take 1 tablet (100 mg total) by mouth 2 (two) times daily for 5 days., Disp: 10 tablet, Rfl: 0   eletriptan (RELPAX) 40 MG tablet, Take 40 mg by mouth as needed for migraine or headache. May repeat in 2 hours if headache persists or recurs., Disp: , Rfl:    ferrous sulfate 325 (65 FE) MG tablet, Take 325 mg by mouth daily with breakfast., Disp: , Rfl:    flavoxATE (URISPAS) 100 MG tablet, Take 100 mg by mouth as needed., Disp: , Rfl:    FLUoxetine (PROZAC) 20 MG capsule, Take 1 tablet by mouth daily., Disp: , Rfl:    fluticasone (FLONASE) 50 MCG/ACT nasal spray, Place 2 sprays into both nostrils daily., Disp: , Rfl:    gabapentin  (NEURONTIN) 300 MG capsule, Take 300 mg by mouth 3 (three) times daily., Disp: , Rfl:    Iron-Vitamin C (VITRON-C) 65-125 MG TABS, Take 1 tablet by mouth daily., Disp: , Rfl:    lidocaine  (XYLOCAINE ) 2 % solution, Use as directed 15 mLs in the mouth or throat as needed for mouth pain., Disp: 100 mL, Rfl: 0   naproxen  (NAPROSYN ) 500 MG tablet, Take 1 tablet (500 mg total) by mouth 2 (two) times daily., Disp: 30 tablet, Rfl: 0   omeprazole (PRILOSEC) 40 MG capsule, Take 40 mg by mouth daily., Disp: , Rfl:    Promethazine  HCl (PHENERGAN  PO), Take 10 mg by mouth., Disp: , Rfl:    promethazine -dextromethorphan (PROMETHAZINE -DM) 6.25-15 MG/5ML syrup, Take 5 mLs by mouth 4 (four) times daily as needed., Disp: 118 mL, Rfl: 0   SAXENDA 18 MG/3ML SOPN, , Disp: , Rfl:    SUMAtriptan (IMITREX) 6 MG/0.5ML SOLN injection, Inject  6 mg into the skin every 2 (two) hours as needed for migraine or headache. May repeat in 2 hours if headache persists or recurs., Disp: , Rfl:    topiramate (TOPAMAX) 100 MG tablet, Take 100 mg by mouth 2 (two) times daily., Disp: , Rfl:    traMADol  (ULTRAM ) 50 MG tablet, Take 1 tablet (50 mg total) by mouth every 8 (eight) hours as needed. (Patient not taking: Reported on 06/04/2021), Disp: 10 tablet, Rfl: 0   triamterene-hydrochlorothiazide (MAXZIDE-25) 37.5-25 MG tablet, Take 1 tablet by mouth daily., Disp: , Rfl:    vitamin B-12 (CYANOCOBALAMIN) 1000 MCG tablet, Take 2,000 mcg by mouth daily., Disp: , Rfl:    Vitamin D , Ergocalciferol , (DRISDOL) 1.25 MG (50000 UT) CAPS capsule, Take 50,000 Units by mouth every 7 (seven) days. , Disp: , Rfl:    ZEPBOUND 10 MG/0.5ML Pen, ADMINISTER 10 MG UNDER THE SKIN EVERY WEEK, Disp: , Rfl:    zolpidem (AMBIEN) 5 MG tablet, Take 5 mg by mouth at bedtime as needed for sleep., Disp: , Rfl:   Observations/Objective: Patient is well-developed, well-nourished in no acute distress.  Resting comfortably at home.  Head is normocephalic, atraumatic.  No  labored breathing.  Speech is clear and coherent with logical content.  Patient is alert and oriented at baseline.    Assessment and Plan: 1. Acute non-recurrent sinusitis, unspecified location (Primary)  2. Mild intermittent asthma with exacerbation  Antibiotic prescribed.  Pt does have a prescription of prednisone  she can begin.  Advised in person follow up if no improvement or symptoms becom worse.   Follow Up Instructions: I discussed the assessment and treatment plan with the patient. The patient was provided an opportunity to ask questions and all were answered. The patient agreed with the plan and demonstrated an understanding of the instructions.  A copy of instructions were sent to the patient via MyChart unless otherwise noted below.     The patient was advised to call back or seek an in-person evaluation if the symptoms worsen or if the condition fails to improve as anticipated.    Harlene PEDLAR Ward, PA-C

## 2023-10-19 NOTE — Patient Instructions (Signed)
 Nena JINNY Hamilton, thank you for joining Harlene PEDLAR Ward, PA-C for today's virtual visit.  While this provider is not your primary care provider (PCP), if your PCP is located in our provider database this encounter information will be shared with them immediately following your visit.   A [place] MyChart account gives you access to today's visit and all your visits, tests, and labs performed at [facility]  click here if you don't have a [place] MyChart account or go to [email or web address]  Consent: (Patient) Marisa Lewis provided verbal consent for this virtual visit at the beginning of the encounter.  Current Medications:  Current Outpatient Medications:    AIMOVIG 140 MG/ML SOAJ, inject 1ml every 24 days for migraine prevention, Disp: , Rfl:    albuterol  (VENTOLIN  HFA) 108 (90 Base) MCG/ACT inhaler, Inhale 1-2 puffs into the lungs every 6 (six) hours as needed for wheezing or shortness of breath., Disp: 1 each, Rfl: 0   benzonatate  (TESSALON ) 100 MG capsule, Take 1 capsule (100 mg total) by mouth every 8 (eight) hours. (Patient not taking: Reported on 06/04/2021), Disp: 21 capsule, Rfl: 0   buPROPion (WELLBUTRIN XL) 300 MG 24 hr tablet, Take 1 tablet by mouth daily., Disp: , Rfl:    cetirizine (ZYRTEC) 10 MG tablet, Take 10 mg by mouth daily., Disp: , Rfl:    citalopram (CELEXA) 40 MG tablet, Take 40 mg by mouth daily., Disp: , Rfl:    cyclobenzaprine (FLEXERIL) 10 MG tablet, Take 10 mg by mouth 3 (three) times daily as needed for muscle spasms., Disp: , Rfl:    doxycycline  (VIBRA -TABS) 100 MG tablet, Take 1 tablet (100 mg total) by mouth 2 (two) times daily for 5 days., Disp: 10 tablet, Rfl: 0   eletriptan (RELPAX) 40 MG tablet, Take 40 mg by mouth as needed for migraine or headache. May repeat in 2 hours if headache persists or recurs., Disp: , Rfl:    ferrous sulfate 325 (65 FE) MG tablet, Take 325 mg by mouth daily with breakfast., Disp: , Rfl:    flavoxATE  (URISPAS) 100 MG tablet, Take 100 mg by mouth as needed., Disp: , Rfl:    FLUoxetine (PROZAC) 20 MG capsule, Take 1 tablet by mouth daily., Disp: , Rfl:    fluticasone (FLONASE) 50 MCG/ACT nasal spray, Place 2 sprays into both nostrils daily., Disp: , Rfl:    gabapentin (NEURONTIN) 300 MG capsule, Take 300 mg by mouth 3 (three) times daily., Disp: , Rfl:    Iron-Vitamin C (VITRON-C) 65-125 MG TABS, Take 1 tablet by mouth daily., Disp: , Rfl:    lidocaine  (XYLOCAINE ) 2 % solution, Use as directed 15 mLs in the mouth or throat as needed for mouth pain., Disp: 100 mL, Rfl: 0   naproxen  (NAPROSYN ) 500 MG tablet, Take 1 tablet (500 mg total) by mouth 2 (two) times daily., Disp: 30 tablet, Rfl: 0   omeprazole (PRILOSEC) 40 MG capsule, Take 40 mg by mouth daily., Disp: , Rfl:    Promethazine  HCl (PHENERGAN  PO), Take 10 mg by mouth., Disp: , Rfl:    promethazine -dextromethorphan (PROMETHAZINE -DM) 6.25-15 MG/5ML syrup, Take 5 mLs by mouth 4 (four) times daily as needed., Disp: 118 mL, Rfl: 0   SAXENDA 18 MG/3ML SOPN, , Disp: , Rfl:    SUMAtriptan (IMITREX) 6 MG/0.5ML SOLN injection, Inject 6 mg into the skin every 2 (two) hours as needed for migraine or headache. May repeat in 2 hours if headache persists or recurs., Disp: ,  Rfl:    topiramate (TOPAMAX) 100 MG tablet, Take 100 mg by mouth 2 (two) times daily., Disp: , Rfl:    traMADol  (ULTRAM ) 50 MG tablet, Take 1 tablet (50 mg total) by mouth every 8 (eight) hours as needed. (Patient not taking: Reported on 06/04/2021), Disp: 10 tablet, Rfl: 0   triamterene-hydrochlorothiazide (MAXZIDE-25) 37.5-25 MG tablet, Take 1 tablet by mouth daily., Disp: , Rfl:    vitamin B-12 (CYANOCOBALAMIN) 1000 MCG tablet, Take 2,000 mcg by mouth daily., Disp: , Rfl:    Vitamin D , Ergocalciferol , (DRISDOL) 1.25 MG (50000 UT) CAPS capsule, Take 50,000 Units by mouth every 7 (seven) days. , Disp: , Rfl:    ZEPBOUND 10 MG/0.5ML Pen, ADMINISTER 10 MG UNDER THE SKIN EVERY WEEK, Disp: ,  Rfl:    zolpidem (AMBIEN) 5 MG tablet, Take 5 mg by mouth at bedtime as needed for sleep., Disp: , Rfl:    Medications ordered in this encounter:  Meds ordered this encounter  Medications   doxycycline  (VIBRA -TABS) 100 MG tablet    Sig: Take 1 tablet (100 mg total) by mouth 2 (two) times daily for 5 days.    Dispense:  10 tablet    Refill:  0    Supervising Provider:   BLAISE ALEENE KIDD [[id number]]     *If you need refills on other medications prior to your next appointment, please contact your pharmacy*  Follow-Up: Call back or seek an in-person evaluation if the symptoms worsen or if the condition fails to improve as anticipated.  [facility] [phone]  Other Instructions Take antibiotic as prescribed.  Can take the prednisone  that was prescribed.  Continue with inhaler as needed.  If no improvement or symptoms become worse recommend in person evaluation.    If you have been instructed to have an in-person evaluation today at a local Urgent Care facility, please use the link below. It will take you to a list of all of our available [place] [facility], including address, phone number and hours of operation. Please do not delay care.  [facility]  If you or a family member do not have a primary care provider, use the link below to schedule a visit and establish care. When you choose a [place] primary care physician or advanced practice provider, you gain a long-term partner in health. Find a Primary Care Provider  Learn more about [place]'s in-office and virtual care options: [place] - Get Care Now
